# Patient Record
Sex: Female | Born: 1950 | Race: White | Hispanic: No | Marital: Married | Smoking: Current every day smoker
Health system: Southern US, Community
[De-identification: ages and names within clinical notes are randomized; demographics above are authoritative.]

## PROBLEM LIST (undated history)

## (undated) DIAGNOSIS — J302 Other seasonal allergic rhinitis: Secondary | ICD-10-CM

## (undated) DIAGNOSIS — R011 Cardiac murmur, unspecified: Secondary | ICD-10-CM

## (undated) HISTORY — DX: Cardiac murmur, unspecified: R01.1

## (undated) HISTORY — PX: HAMMER TOE SURGERY: SHX385

## (undated) HISTORY — DX: Other seasonal allergic rhinitis: J30.2

---

## 1998-02-15 ENCOUNTER — Emergency Department (HOSPITAL_COMMUNITY): Admission: EM | Admit: 1998-02-15 | Discharge: 1998-02-15 | Payer: Self-pay | Admitting: Emergency Medicine

## 1998-07-06 ENCOUNTER — Other Ambulatory Visit: Admission: RE | Admit: 1998-07-06 | Discharge: 1998-07-06 | Payer: Self-pay | Admitting: Gynecology

## 1998-07-25 ENCOUNTER — Other Ambulatory Visit: Admission: RE | Admit: 1998-07-25 | Discharge: 1998-07-25 | Payer: Self-pay | Admitting: Gynecology

## 1998-08-18 ENCOUNTER — Ambulatory Visit (HOSPITAL_COMMUNITY): Admission: RE | Admit: 1998-08-18 | Discharge: 1998-08-18 | Payer: Self-pay | Admitting: Gynecology

## 1999-09-03 ENCOUNTER — Encounter: Admission: RE | Admit: 1999-09-03 | Discharge: 1999-09-03 | Payer: Self-pay | Admitting: Gynecology

## 1999-09-03 ENCOUNTER — Encounter: Payer: Self-pay | Admitting: Gynecology

## 1999-09-06 ENCOUNTER — Encounter: Admission: RE | Admit: 1999-09-06 | Discharge: 1999-09-06 | Payer: Self-pay | Admitting: Gynecology

## 1999-09-06 ENCOUNTER — Encounter: Payer: Self-pay | Admitting: Gynecology

## 2001-06-15 ENCOUNTER — Other Ambulatory Visit: Admission: RE | Admit: 2001-06-15 | Discharge: 2001-06-15 | Payer: Self-pay | Admitting: Gynecology

## 2002-09-06 ENCOUNTER — Other Ambulatory Visit: Admission: RE | Admit: 2002-09-06 | Discharge: 2002-09-06 | Payer: Self-pay | Admitting: Gynecology

## 2009-06-15 ENCOUNTER — Encounter: Admission: RE | Admit: 2009-06-15 | Discharge: 2009-06-15 | Payer: Self-pay | Admitting: Family Medicine

## 2012-10-08 ENCOUNTER — Other Ambulatory Visit: Payer: Self-pay | Admitting: Obstetrics and Gynecology

## 2012-10-08 DIAGNOSIS — R928 Other abnormal and inconclusive findings on diagnostic imaging of breast: Secondary | ICD-10-CM

## 2012-10-29 ENCOUNTER — Ambulatory Visit
Admission: RE | Admit: 2012-10-29 | Discharge: 2012-10-29 | Disposition: A | Discharge status: Home / Self Care | Payer: BC Managed Care – PPO | Source: Ambulatory Visit | Attending: Obstetrics and Gynecology | Admitting: Obstetrics and Gynecology

## 2012-10-29 DIAGNOSIS — R928 Other abnormal and inconclusive findings on diagnostic imaging of breast: Secondary | ICD-10-CM

## 2013-11-04 ENCOUNTER — Other Ambulatory Visit: Payer: Self-pay

## 2013-11-04 DIAGNOSIS — Z1231 Encounter for screening mammogram for malignant neoplasm of breast: Secondary | ICD-10-CM

## 2013-11-25 ENCOUNTER — Ambulatory Visit
Admission: RE | Admit: 2013-11-25 | Discharge: 2013-11-25 | Disposition: A | Discharge status: Home / Self Care | Payer: BC Managed Care – PPO | Source: Ambulatory Visit

## 2013-11-25 DIAGNOSIS — Z1231 Encounter for screening mammogram for malignant neoplasm of breast: Secondary | ICD-10-CM

## 2014-07-20 LAB — LIPID PANEL
Cholesterol: 173 mg/dL (ref 0–200)
HDL: 43 mg/dL (ref 35–70)
LDL Cholesterol: 91 mg/dL
Triglycerides: 196 mg/dL — AB (ref 40–160)

## 2014-07-20 LAB — HEPATIC FUNCTION PANEL
ALT: 19 U/L (ref 7–35)
AST: 15 U/L (ref 13–35)
Alkaline Phosphatase: 86 U/L (ref 25–125)
BILIRUBIN, TOTAL: 0.7 mg/dL

## 2014-07-20 LAB — BASIC METABOLIC PANEL
BUN: 14 mg/dL (ref 4–21)
CREATININE: 0.7 mg/dL (ref 0.5–1.1)
GLUCOSE: 96 mg/dL
POTASSIUM: 4.2 mmol/L (ref 3.4–5.3)
Sodium: 137 mmol/L (ref 137–147)

## 2014-07-20 LAB — TSH: TSH: 2.7 u[IU]/mL (ref 0.41–5.90)

## 2014-07-20 LAB — HEMOGLOBIN A1C: HEMOGLOBIN A1C: 6

## 2015-12-19 ENCOUNTER — Ambulatory Visit (INDEPENDENT_AMBULATORY_CARE_PROVIDER_SITE_OTHER): Payer: 59

## 2015-12-19 ENCOUNTER — Encounter: Payer: Self-pay | Admitting: Podiatry

## 2015-12-19 ENCOUNTER — Ambulatory Visit (INDEPENDENT_AMBULATORY_CARE_PROVIDER_SITE_OTHER): Payer: 59 | Admitting: Podiatry

## 2015-12-19 VITALS — BP 144/97 | HR 106 | Resp 16 | Ht 61.0 in | Wt 168.0 lb

## 2015-12-19 DIAGNOSIS — L600 Ingrowing nail: Secondary | ICD-10-CM | POA: Diagnosis not present

## 2015-12-19 DIAGNOSIS — M2011 Hallux valgus (acquired), right foot: Secondary | ICD-10-CM | POA: Diagnosis not present

## 2015-12-19 DIAGNOSIS — M205X1 Other deformities of toe(s) (acquired), right foot: Secondary | ICD-10-CM | POA: Diagnosis not present

## 2015-12-19 DIAGNOSIS — M2041 Other hammer toe(s) (acquired), right foot: Secondary | ICD-10-CM

## 2015-12-19 MED ORDER — NEOMYCIN-POLYMYXIN-HC 3.5-10000-1 OT SOLN: OTIC | Status: DC

## 2015-12-19 NOTE — Patient Instructions (Addendum)

## 2015-12-19 NOTE — Progress Notes (Signed)
   Subjective:    Patient ID: Meghan Cox, female    DOB: 06/12/1951, 65 y.o.   MRN: [id number]  HPI: She presents today as a 65 year old female with a chief complaint of a painful first metatarsophalangeal joint and second toe as well as her primary complaint being a painful hallux nail right. She states that the nail is thick and painful and it hurts if it is touched. She states that the bunion deformity has even drained before. She states that it has limited her from performing her daily activities that she enjoys.    Review of Systems  All other systems reviewed and are negative.      Objective:   Physical Exam: Vital signs are stable she is alert and oriented 3. Pulses are palpable. Neurologic sensorium is intact per Semmes-Weinstein monofilament. Deep tendon reflexes are intact. Muscle strength +5 over 5 dorsiflexion plantar flexors and inverters and everters, all intrinsic musculature is intact. Orthopedic evaluation demonstrates severe hallux abductovalgus deformity with hallux limitus of the first metatarsophalangeal joint of the right foot. Large dorsal spur is noted on the right foot and is visible on radiograph as well. There is a small punctate lesion where drainage has been present in the past. Radiograph does confirm complete loss of joint space with hallux valgus deformity as well as dorsal spurring being severe. Mild hammertoe deformity second digit right foot. Confirmed by radiograph as well. Cutaneous evaluation demonstrates painful hallux right foot. Mild paronychia sharp incurvated nail margins.      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 Hammertoe deformity second digit right foot. Ingrown toenail tibial and fibular border of the hallux right.  Plan: We discussed etiology pathology conservative or surgical therapies. We discussed options for the first metatarsophalangeal joint. At this point we also performed a chemical  matrixectomy to the tibial and fibular border of the hallux right foot. This is performed after local anesthesia was administered. She tolerated this procedure well and I will follow-up with her in 1 week. She was given both oral and written home-going instructions for care and soaking of her toe. She was also provided with a prescription for Cortisporin Otic to be applied twice daily after soaking. We will once again discuss permanent correction of the first metatarsophalangeal joint next visit.

## 2015-12-28 ENCOUNTER — Encounter: Payer: Self-pay | Admitting: Podiatry

## 2015-12-28 ENCOUNTER — Ambulatory Visit (INDEPENDENT_AMBULATORY_CARE_PROVIDER_SITE_OTHER): Payer: 59 | Admitting: Podiatry

## 2015-12-28 DIAGNOSIS — L600 Ingrowing nail: Secondary | ICD-10-CM

## 2015-12-28 NOTE — Patient Instructions (Signed)

## 2015-12-28 NOTE — Progress Notes (Signed)
She presents today for follow-up of matrixectomy hallux right. She states it seems to be doing very well she's happy with the outcome thus far. She continues to second Betadine warm water and apply Cortisporin otic as directed.  Objective: Vital signs are stable she is alert and oriented 3. No erythema edema cellulitis drainage or odor to the hallux right status post matrixectomy. This appears to be healing in very nicely with granulation tissue and epithelialization. No signs of infection.  Assessment: Well-healing matrixectomy hallux right.  Plan: I recommend that she continue soaking in Epsom salts and warm water covered in the daytime and leave open at bedtime. I also recommended that we consider surgical discussion at her next visit regarding her Keller arthroplasty first metatarsophalangeal joint in 2 weeks.

## 2016-01-05 ENCOUNTER — Other Ambulatory Visit: Payer: Self-pay | Admitting: Gastroenterology

## 2016-01-05 LAB — HM COLONOSCOPY

## 2016-01-11 ENCOUNTER — Encounter: Payer: Self-pay | Admitting: Podiatry

## 2016-01-11 ENCOUNTER — Ambulatory Visit (INDEPENDENT_AMBULATORY_CARE_PROVIDER_SITE_OTHER): Payer: 59 | Admitting: Podiatry

## 2016-01-11 VITALS — BP 113/75 | HR 106 | Resp 16

## 2016-01-11 DIAGNOSIS — M205X1 Other deformities of toe(s) (acquired), right foot: Secondary | ICD-10-CM

## 2016-01-11 NOTE — Progress Notes (Signed)
She presents today for follow-up of her matrixectomy hallux right. She states it is doing very well and she has discontinued soaking. She would like to go over his surgical consent today regarding her right foot. She states that the right first metatarsophalangeal joint as well as the left are extremely painful. They have large bumps that do not allow her to wear regular shoes and are painful all the time. She states that her toes don't band and is like walking on the end of the toe all the time. She denies changes in her past medical history medications allergies surgeries and social history.  Objective: Vital signs are stable alert and oriented 3. Pulses are palpable. She has no range of motion of the first metatarsophalangeal joint large dorsal spur almost protruding through the skin at this point. Capillary fill time to the first digit is immediate. She has mild edema about the foot and the surgical site has gone on to heal uneventfully.  Assessment: Well-healing matrixectomy tibial border hallux right. Severe hallux rigidus first metatarsophalangeal joint of the right foot.  Plan: Discussed etiology pathology conservative versus surgical therapies. At this point we consented her today for a Keller arthroplasty with a single silicone implant first metatarsophalangeal joint of the left foot. I answered all questions regarding this procedure to the best of my ability in layman's terms. She understands this is amenable to inside all 3 pages of the consent form. We did discuss the possible postop complications which may include postop pain bleeding swelling infection recurrence and need for further surgery loss of digit loss of limb loss of life. She understands this is amenable to it and sign off. Age of the consent form. We dispensed a cam walker for her recovery and I will follow up with her in the near future for surgery.

## 2016-01-11 NOTE — Patient Instructions (Signed)
Pre-Operative Instructions  Congratulations, you have decided to take an important step to improving your quality of life.  You can be assured that the doctors of [facility] will be with you every step of the way.  1. Plan to be at the surgery center/hospital at least 1 (one) hour prior to your scheduled time unless otherwise directed by the surgical center/hospital staff.  You must have a responsible adult accompany you, remain during the surgery and drive you home.  Make sure you have directions to the surgical center/hospital and know how to get there on time. 2. For hospital based surgery you will need to obtain a history and physical form from your family physician within 1 month prior to the date of surgery- we will give you a form for you primary physician.  3. We make every effort to accommodate the date you request for surgery.  There are however, times where surgery dates or times have to be moved.  We will contact you as soon as possible if a change in schedule is required.   4. No Aspirin/Ibuprofen for one week before surgery.  If you are on aspirin, any non-steroidal anti-inflammatory medications (Mobic, Aleve, Ibuprofen) you should stop taking it 7 days prior to your surgery.  You make take Tylenol  For pain prior to surgery.  5. Medications- If you are taking daily heart and blood pressure medications, seizure, reflux, allergy, asthma, anxiety, pain or diabetes medications, make sure the surgery center/hospital is aware before the day of surgery so they may notify you which medications to take or avoid the day of surgery. 6. No food or drink after midnight the night before surgery unless directed otherwise by surgical center/hospital staff. 7. No alcoholic beverages 24 hours prior to surgery.  No smoking 24 hours prior to or 24 hours after surgery. 8. Wear loose pants or shorts- loose enough to fit over bandages, boots, and casts. 9. No slip on shoes, sneakers are best. 10. Bring  your boot with you to the surgery center/hospital.  Also bring crutches or a walker if your physician has prescribed it for you.  If you do not have this equipment, it will be provided for you after surgery. 11. If you have not been contracted by the surgery center/hospital by the day before your surgery, call to confirm the date and time of your surgery. 12. Leave-time from work may vary depending on the type of surgery you have.  Appropriate arrangements should be made prior to surgery with your employer. 13. Prescriptions will be provided immediately following surgery by your doctor.  Have these filled as soon as possible after surgery and take the medication as directed. 14. Remove nail polish on the operative foot. 15. Wash the night before surgery.  The night before surgery wash the foot and leg well with the antibacterial soap provided and water paying special attention to beneath the toenails and in between the toes.  Rinse thoroughly with water and dry well with a towel.  Perform this wash unless told not to do so by your physician.  Enclosed: 1 Ice pack (please put in freezer the night before surgery)   1 Hibiclens skin cleaner   Pre-op Instructions  If you have any questions regarding the instructions, do not hesitate to call our office.  [place]: [address] [place], [place] [address] [phone]  [place]: [address] [place], [place] [address] [phone]  [place]: [address]  [place], [place] [address] [phone]  Dr. Richard   Tuchman DPM, Dr. Norman Regal DPM Dr. Richard Sikora DPM, Dr. M. Todd Hyatt DPM, Dr. Kathryn Egerton DPM 

## 2016-02-28 ENCOUNTER — Other Ambulatory Visit: Payer: Self-pay | Admitting: Podiatry

## 2016-02-28 MED ORDER — PROMETHAZINE HCL 25 MG PO TABS: 25.0000 mg | ORAL_TABLET | Freq: Three times a day (TID) | ORAL | Status: DC | PRN

## 2016-02-28 MED ORDER — OXYCODONE-ACETAMINOPHEN 10-325 MG PO TABS: ORAL_TABLET | ORAL | Status: DC

## 2016-02-28 MED ORDER — CEPHALEXIN 500 MG PO CAPS: 500.0000 mg | ORAL_CAPSULE | Freq: Three times a day (TID) | ORAL | Status: DC

## 2016-03-01 ENCOUNTER — Encounter: Payer: Self-pay | Admitting: Podiatry

## 2016-03-01 DIAGNOSIS — M2011 Hallux valgus (acquired), right foot: Secondary | ICD-10-CM | POA: Diagnosis not present

## 2016-03-04 ENCOUNTER — Telehealth: Payer: Self-pay | Admitting: *Deleted

## 2016-03-04 NOTE — Progress Notes (Signed)
DOS 03/01/2016 Lorenz CoasterKeller Bunionectomy with silicone implant 1st MPJ right foot.

## 2016-03-04 NOTE — Telephone Encounter (Signed)
Post op courtesy call-I spoke with pt's husband, Leonette MostCharles states pt is at work, I could call her on her mobile phone.  I spoke with pt, she states she's doing great, didn't even have to take a pill, is sitting at her desk with her leg on a pillow.  I reminded pt of the RICE instructions on her post op sheet, to not let the foot dangle or weight bear more than 15 mins/hour, remain in the boot at all times, leave the original surgical dressing in place it will be changed at the 1st POV by Dr. Al CorpusHyatt and to call with concerns.

## 2016-03-04 NOTE — Telephone Encounter (Signed)
ok 

## 2016-03-07 ENCOUNTER — Ambulatory Visit (INDEPENDENT_AMBULATORY_CARE_PROVIDER_SITE_OTHER): Payer: 59

## 2016-03-07 ENCOUNTER — Ambulatory Visit (INDEPENDENT_AMBULATORY_CARE_PROVIDER_SITE_OTHER): Payer: 59 | Admitting: Podiatry

## 2016-03-07 VITALS — BP 124/82 | HR 75 | Temp 98.2°F | Resp 16

## 2016-03-07 DIAGNOSIS — M2041 Other hammer toe(s) (acquired), right foot: Secondary | ICD-10-CM

## 2016-03-07 DIAGNOSIS — Z9889 Other specified postprocedural states: Secondary | ICD-10-CM | POA: Diagnosis not present

## 2016-03-07 DIAGNOSIS — M205X1 Other deformities of toe(s) (acquired), right foot: Secondary | ICD-10-CM

## 2016-03-08 NOTE — Progress Notes (Signed)
Subjective:     Patient ID: Meghan FinnerJanice M Egnor, female   DOB: 1951-02-04, 65 y.o.   MRN: [id number]  HPI patient states that she's doing well with her right foot with minimal pain or swelling and able to walk on her foot with boot   Review of Systems     Objective:   Physical Exam Neurovascular status intact negative Homans sign was noted with wound edges well coapted first MPJ right. Range of motion is adequate with no crepitus of the joint and patient has minimal proximal edema noted    Assessment:     Doing well post implant procedure first MPJ    Plan:     H&P and x-rays reviewed with patient. At this point I reapplied sterile dressing instructed on continued immobilization gradual range of motion exercises and reappoint for evaluation in 2 weeks or earlier if needed  X-ray report indicated that the implant is in place with good alignment noted

## 2016-03-19 ENCOUNTER — Ambulatory Visit (INDEPENDENT_AMBULATORY_CARE_PROVIDER_SITE_OTHER): Payer: 59 | Admitting: Podiatry

## 2016-03-19 ENCOUNTER — Encounter: Payer: Self-pay | Admitting: Podiatry

## 2016-03-19 VITALS — BP 144/85 | HR 88 | Resp 16

## 2016-03-19 DIAGNOSIS — Z9889 Other specified postprocedural states: Secondary | ICD-10-CM | POA: Diagnosis not present

## 2016-03-19 DIAGNOSIS — M2041 Other hammer toe(s) (acquired), right foot: Secondary | ICD-10-CM

## 2016-03-19 DIAGNOSIS — M205X1 Other deformities of toe(s) (acquired), right foot: Secondary | ICD-10-CM | POA: Diagnosis not present

## 2016-03-20 NOTE — Progress Notes (Signed)
She presents today 2-1/2 weeks status post Keller implant right foot. She states this seems to be doing okay.  Objective: Vital signs are stable she is alert and oriented 3. There is no erythema edema cellulitis drainage or odor she has good range of motion of the first metatarsophalangeal joint. Sutures were removed and margins remain well coapted but allow her to start getting this wet and massaging it.  Assessment: Well-healing surgical foot date of surgery 03/01/2016 Keller arthroplasty right foot.  Plan: I placed her in a Darco shoe today with compression anklet and I will follow-up with her in 2 weeks.

## 2016-04-04 ENCOUNTER — Encounter: Payer: Self-pay | Admitting: Podiatry

## 2016-04-04 ENCOUNTER — Ambulatory Visit (INDEPENDENT_AMBULATORY_CARE_PROVIDER_SITE_OTHER): Payer: 59

## 2016-04-04 ENCOUNTER — Ambulatory Visit (INDEPENDENT_AMBULATORY_CARE_PROVIDER_SITE_OTHER): Payer: 59 | Admitting: Podiatry

## 2016-04-04 VITALS — BP 125/80 | HR 100 | Resp 12

## 2016-04-04 DIAGNOSIS — M2041 Other hammer toe(s) (acquired), right foot: Secondary | ICD-10-CM

## 2016-04-04 DIAGNOSIS — M205X1 Other deformities of toe(s) (acquired), right foot: Secondary | ICD-10-CM

## 2016-04-04 DIAGNOSIS — Z9889 Other specified postprocedural states: Secondary | ICD-10-CM

## 2016-04-04 NOTE — Progress Notes (Signed)
She presents today for follow-up of Keller arthroplasty with a single silicone implant right foot. She states that she is just about ready to get out of her Darco shoe. She states that the foot really doesn't hurt anymore and she is ready to get going. She denies fever chills nausea vomiting muscle aches and pains. States that he has really taken a turn for the better over the past few days.  Objective: Vital signs are stable she's alert and oriented 3 she has great range of motion of the first metatarsophalangeal joint with almost no edema to the foot. Radiographs demonstrate a well-placed Keller arthroplasty with a single silicone implant and grommets collars.  Assessment: Well-healing surgical foot 1 month.  Plan: I encouraged range of motion exercises and will allow her to get back into regular shoe gear. Follow up with me in 1 month.

## 2016-04-11 LAB — HM PAP SMEAR: HM Pap smear: NORMAL

## 2016-04-23 ENCOUNTER — Other Ambulatory Visit: Payer: Self-pay | Admitting: Obstetrics and Gynecology

## 2016-04-23 DIAGNOSIS — R928 Other abnormal and inconclusive findings on diagnostic imaging of breast: Secondary | ICD-10-CM

## 2016-05-02 ENCOUNTER — Encounter: Payer: 59 | Admitting: Podiatry

## 2016-05-03 ENCOUNTER — Ambulatory Visit
Admission: RE | Admit: 2016-05-03 | Discharge: 2016-05-03 | Disposition: A | Discharge status: Home / Self Care | Payer: 59 | Source: Ambulatory Visit | Attending: Obstetrics and Gynecology | Admitting: Obstetrics and Gynecology

## 2016-05-03 DIAGNOSIS — R928 Other abnormal and inconclusive findings on diagnostic imaging of breast: Secondary | ICD-10-CM

## 2016-05-14 ENCOUNTER — Encounter: Payer: 59 | Admitting: Podiatry

## 2016-05-28 ENCOUNTER — Encounter: Payer: 59 | Admitting: Podiatry

## 2016-06-04 ENCOUNTER — Encounter: Payer: 59 | Admitting: Podiatry

## 2017-02-13 ENCOUNTER — Encounter: Payer: Self-pay | Admitting: *Deleted

## 2017-02-13 NOTE — Telephone Encounter (Signed)
This encounter was created in error - please disregard.

## 2017-02-14 ENCOUNTER — Ambulatory Visit: Payer: 59 | Admitting: Family Medicine

## 2017-02-21 ENCOUNTER — Ambulatory Visit (INDEPENDENT_AMBULATORY_CARE_PROVIDER_SITE_OTHER): Payer: 59 | Admitting: Family Medicine

## 2017-02-21 ENCOUNTER — Encounter: Payer: Self-pay | Admitting: Family Medicine

## 2017-02-21 VITALS — BP 136/88 | HR 86 | Temp 98.4°F | Ht 61.0 in | Wt 173.6 lb

## 2017-02-21 DIAGNOSIS — Z0000 Encounter for general adult medical examination without abnormal findings: Secondary | ICD-10-CM | POA: Diagnosis not present

## 2017-02-21 DIAGNOSIS — E785 Hyperlipidemia, unspecified: Secondary | ICD-10-CM

## 2017-02-21 DIAGNOSIS — F17200 Nicotine dependence, unspecified, uncomplicated: Secondary | ICD-10-CM

## 2017-02-21 LAB — COMPREHENSIVE METABOLIC PANEL
ALT: 15 U/L (ref 0–35)
AST: 14 U/L (ref 0–37)
Albumin: 4 g/dL (ref 3.5–5.2)
Alkaline Phosphatase: 74 U/L (ref 39–117)
BUN: 13 mg/dL (ref 6–23)
CO2: 26 mEq/L (ref 19–32)
Calcium: 9.3 mg/dL (ref 8.4–10.5)
Chloride: 108 mEq/L (ref 96–112)
Creatinine, Ser: 0.78 mg/dL (ref 0.40–1.20)
GFR: 78.73 mL/min (ref >60.00)
Glucose, Bld: 100 mg/dL — ABNORMAL HIGH (ref 70–99)
Potassium: 4.1 mEq/L (ref 3.5–5.1)
Sodium: 139 mEq/L (ref 135–145)
Total Bilirubin: 0.5 mg/dL (ref 0.2–1.2)
Total Protein: 7 g/dL (ref 6.0–8.3)

## 2017-02-21 LAB — LDL CHOLESTEROL, DIRECT: Direct LDL: 176 mg/dL

## 2017-02-21 LAB — LIPID PANEL
Cholesterol: 259 mg/dL — ABNORMAL HIGH (ref 0–200)
HDL: 44.2 mg/dL (ref >39.00)
NonHDL: 214.57
Total CHOL/HDL Ratio: 6
Triglycerides: 251 mg/dL — ABNORMAL HIGH (ref 0.0–149.0)
VLDL: 50.2 mg/dL — ABNORMAL HIGH (ref 0.0–40.0)

## 2017-02-21 MED ORDER — ATORVASTATIN CALCIUM 20 MG PO TABS: 20.0000 mg | ORAL_TABLET | Freq: Every day | ORAL | 3 refills | Status: DC

## 2017-02-21 NOTE — Patient Instructions (Signed)
   [place] QUITLINE: 1-800-QUIT-NOW (1-800-784-8669)  

## 2017-02-21 NOTE — Progress Notes (Signed)
Meghan Cox is a 66 y.o. female is here to [facility]   History of Present Illness:   Britt Bottom CMA acting as scribe for Dr. Earlene Plater.  CC: Patient is here today to establish care today. No concerns at this time.   HPI:  Meghan Cox is a 66 y.o. female who presents for evaluation of dyslipidemia. The patient does not use medications that may worsen dyslipidemias (corticosteroids, progestins, anabolic steroids, diuretics, beta-blockers, amiodarone, cyclosporine, olanzapine). Exercise: intermittently. Previous history of cardiac disease includes: None. Cardiovascular ROS: no chest pain or dyspnea on exertion.  Cardiac Risk Factors Age > 45-female, > 55-female:  YES  +1  Smoking:   YES  +1  Sig. family hx of CHD*:  NO  Hypertension:   NO  Diabetes:   NO  HDL < 35:   NO  HDL > 59:   NO  Total:  2   *Significant family history of CHD per NCEP = MI or sudden death at less than 35 year old in father or other 1st-degree female relative, or less than 72 year old in mother or other 1st-degree female relative.  Lipids:    Component Value Date/Time   CHOL 259 (H) 02/21/2017 0844   TRIG 251.0 (H) 02/21/2017 0844   HDL 44.20 02/21/2017 0844   LDLDIRECT 176.0 02/21/2017 0844   VLDL 50.2 (H) 02/21/2017 0844   CHOLHDL 6 02/21/2017 0844   Health Maintenance Due  Topic Date Due  . Hepatitis C Screening  1951-09-23  . HIV Screening  12/15/1966  . TETANUS/TDAP  12/15/1970  . PNA vac Low Risk Adult (1 of 2 - PCV13) 12/15/2016   PMHx, SurgHx, SocialHx, Medications, and Allergies were reviewed in the Visit Navigator and updated as appropriate.   Past Medical History:  Diagnosis Date  . Heart murmur   . Seasonal allergies    Past Surgical History:  Procedure Laterality Date  . HAMMER TOE SURGERY     Family History  Problem Relation Age of Onset  . Heart disease Mother   . Hyperlipidemia Mother   . Cancer Father     Prostate  . Hypertension Father   .  Hypertension Maternal Grandmother   . Hypertension Maternal Grandfather   . Cancer Paternal Grandmother     Breast  . Hypertension Paternal Grandfather    Social History  Substance Use Topics  . Smoking status: Current Every Day Smoker    Packs/day: 0.50    Types: Cigarettes  . Smokeless tobacco: Never Used  . Alcohol use No   Current Medications and Allergies:   Current Outpatient Prescriptions:  .  atorvastatin (LIPITOR) 20 MG tablet, Take 1 tablet (20 mg total) by mouth daily., Disp: 90 tablet, Rfl: 3 .  loratadine (CLARITIN) 10 MG tablet, Take 10 mg by mouth daily., Disp: , Rfl:   Allergies  Allergen Reactions  . Other     Peaches-makes throat swell up   Review of Systems:   Review of Systems  Constitutional: Negative for chills, fever and malaise/fatigue.  HENT: Negative for congestion, ear pain, sinus pain and sore throat.   Eyes: Negative for blurred vision and double vision.  Respiratory: Negative for cough, shortness of breath and wheezing.   Cardiovascular: Negative for chest pain, palpitations and leg swelling.  Gastrointestinal: Negative for abdominal pain, constipation, diarrhea and nausea.  Genitourinary: Negative for dysuria.  Musculoskeletal: Negative for back pain, joint pain and neck pain.  Skin: Negative for itching and rash.  Neurological: Negative  for dizziness and headaches.  Psychiatric/Behavioral: Negative for depression, hallucinations and memory loss.    Vitals:   Vitals:   02/21/17 0746  BP: 136/88  Pulse: 86  Temp: 98.4 F (36.9 C)  TempSrc: Oral  SpO2: 97%  Weight: 173 lb 9.6 oz (78.7 kg)  Height:  (1.549 m)     Body mass index is 32.8 kg/m.  Physical Exam:   Physical Exam  Constitutional: She is oriented to person, place, and time. She appears well-developed and well-nourished. No distress.  HENT:  Head: Normocephalic and atraumatic.  Eyes: Conjunctivae and EOM are normal. Pupils are equal, round, and reactive to light.   Neck: Normal range of motion. Neck supple. No thyromegaly present.  Cardiovascular: Normal rate, regular rhythm, normal heart sounds and intact distal pulses.   Pulmonary/Chest: Effort normal and breath sounds normal.  Abdominal: Soft. Bowel sounds are normal.  Musculoskeletal: Normal range of motion.  Neurological: She is alert and oriented to person, place, and time.  Skin: Skin is warm.  Psychiatric: She has a normal mood and affect. Her behavior is normal.  Nursing note and vitals reviewed.    Assessment and Plan:    Kleigh was seen today for establish care.  Diagnoses and all orders for this visit:  Routine physical examination -     Comprehensive metabolic panel  Hyperlipidemia, unspecified hyperlipidemia type -     Lipid panel -     atorvastatin (LIPITOR) 20 MG tablet; Take 1 tablet (20 mg total) by mouth daily.  Smoker Comments: Tobacco Cessation Counseling:  Smoking cessation counseling was provided.  Approximately 10 minutes were spent discussing the rationale for tobacco cessation and strategies for doing so.  Adjuncts, including nicotine patches, varenicline and buproprion were recommended. Patient is eligible for low-dose CT scan for lung cancer screening based on the criteria noted in the HPI. She will consider this.    . Reviewed expectations re: course of current medical issues. . Discussed self-management of symptoms. . Outlined signs and symptoms indicating need for more acute intervention. . Patient verbalized understanding and all questions were answered. . See orders for this visit as documented in the electronic medical record. . Patient received an After Visit Summary.  CMA served as Neurosurgeon during this visit. History, Physical, and Plan performed by medical provider. Documentation and orders reviewed and attested to. Helane Rima, D.O.  Helane Rima, D.O. [place], [facility] 02/21/2017   Follow-up: Return in about 6 months (around  08/23/2017).  Meds ordered this encounter  Medications  . atorvastatin (LIPITOR) 20 MG tablet    Sig: Take 1 tablet (20 mg total) by mouth daily.    Dispense:  90 tablet    Refill:  3   Medications Discontinued During This Encounter  Medication Reason  . atorvastatin (LIPITOR) 20 MG tablet Reorder   Orders Placed This Encounter  Procedures  . Comprehensive metabolic panel  . Lipid panel  . LDL cholesterol, direct

## 2017-02-24 ENCOUNTER — Encounter: Payer: Self-pay | Admitting: Family Medicine

## 2017-03-05 ENCOUNTER — Telehealth: Payer: Self-pay | Admitting: Family Medicine

## 2017-03-05 NOTE — Telephone Encounter (Signed)
[facility] on [address]

## 2017-03-18 ENCOUNTER — Telehealth: Payer: Self-pay | Admitting: Family Medicine

## 2017-03-18 NOTE — Telephone Encounter (Signed)
Rec'd from [facility] [employer] forward 27 Pages to [employer]. BerkleyErica Wallace DO

## 2017-03-26 ENCOUNTER — Encounter: Payer: Self-pay | Admitting: Family Medicine

## 2017-03-26 LAB — VITAMIN D 1,25 DIHYDROXY: Vit D, 25-Hydroxy: 12.9

## 2017-04-11 ENCOUNTER — Ambulatory Visit (INDEPENDENT_AMBULATORY_CARE_PROVIDER_SITE_OTHER): Payer: 59 | Admitting: *Deleted

## 2017-04-11 DIAGNOSIS — Z23 Encounter for immunization: Secondary | ICD-10-CM

## 2017-07-01 ENCOUNTER — Ambulatory Visit (INDEPENDENT_AMBULATORY_CARE_PROVIDER_SITE_OTHER): Payer: 59 | Admitting: Family Medicine

## 2017-07-01 ENCOUNTER — Telehealth: Payer: Self-pay | Admitting: Family Medicine

## 2017-07-01 ENCOUNTER — Encounter: Payer: Self-pay | Admitting: Family Medicine

## 2017-07-01 VITALS — BP 180/100 | HR 98 | Ht 61.0 in | Wt 173.4 lb

## 2017-07-01 DIAGNOSIS — M7632 Iliotibial band syndrome, left leg: Secondary | ICD-10-CM

## 2017-07-01 DIAGNOSIS — E669 Obesity, unspecified: Secondary | ICD-10-CM

## 2017-07-01 DIAGNOSIS — F17210 Nicotine dependence, cigarettes, uncomplicated: Secondary | ICD-10-CM

## 2017-07-01 DIAGNOSIS — R030 Elevated blood-pressure reading, without diagnosis of hypertension: Secondary | ICD-10-CM

## 2017-07-01 MED ORDER — DICLOFENAC SODIUM 75 MG PO TBEC: 75.0000 mg | DELAYED_RELEASE_TABLET | Freq: Two times a day (BID) | ORAL | 0 refills | Status: DC

## 2017-07-01 MED ORDER — CYCLOBENZAPRINE HCL 10 MG PO TABS
10.0000 mg | ORAL_TABLET | Freq: Three times a day (TID) | ORAL | 0 refills | Status: DC | PRN
Start: 2017-07-01 — End: 2017-07-21

## 2017-07-01 NOTE — Progress Notes (Signed)
Meghan RutterJanice Marie Cox is a 66 y.o. female here for an acute visit.  History of Present Illness:   Autumn Uvaldo RisingMcNeil, cma is acting as a Neurosurgeonscribe for W[employer] R[employer] BerkleyErica Monta Maiorana, DO.  HPI:  Meghan NixonJanice was moving 3 weeks ago when she felt a pull in her left lower back. Took Ibuprofen and sx resolved hours later. Next morning she reports Left anterior Hip Pain radiating down to anterior left thigh. Describes the pain as dull, pulsating, tightness. Triggers are first thing in the morning, standing for long periods of time and walking. Some relief with rest, icy hot, hot shower, and alternating Ibuprofen/tylenol. Pending bone density scan in October. No issues with sleeping.  PMHx, SurgHx, SocialHx, Medications, and Allergies were reviewed in the Visit Navigator and updated as appropriate.  Current Medications:   .  atorvastatin (LIPITOR) 20 MG tablet, Take 1 tablet (20 mg total) by mouth daily., Disp: 90 tablet, Rfl: 3 .  loratadine (CLARITIN) 10 MG tablet, Take 10 mg by mouth daily., Disp: , Rfl:    Allergies  Allergen Reactions  . Other     Peaches-makes throat swell up   Review of Systems:   Pertinent items are noted in the HPI. Otherwise, ROS is negative.  Vitals:   Vitals:   07/01/17 1530  BP: (!) 180/100  Pulse: 98  SpO2: 97%  Weight: 173 lb 6.4 oz (78.7 kg)  Height: 5\' 1"  (1.549 m)     Body mass index is 32.76 kg/m.   Physical Exam:   Physical Exam  Constitutional: She appears well-nourished.  HENT:  Head: Normocephalic and atraumatic.  Eyes: Pupils are equal, round, and reactive to light. EOM are normal.  Neck: Normal range of motion. Neck supple.  Cardiovascular: Normal rate, regular rhythm, normal heart sounds and intact distal pulses.   Pulmonary/Chest: Effort normal.  Abdominal: Soft.  Musculoskeletal:  Pain at both proximal and distal insertions of IT band.   Skin: Skin is warm.  Psychiatric: She has a normal mood and affect. Her behavior is normal.  Nursing note and vitals  reviewed.   Assessment and Plan:   Meghan NixonJanice was seen today for left leg pain.  Diagnoses and all orders for this visit:  Iliotibial band syndrome of left side Comments: Orders as below along with handout of exercises and stretches. Call if not improving.  Orders: -     diclofenac (VOLTAREN) 75 MG EC tablet; Take 1 tablet (75 mg total) by mouth 2 (two) times daily. -     cyclobenzaprine (FLEXERIL) 10 MG tablet; Take 1 tablet (10 mg total) by mouth 3 (three) times daily as needed for muscle spasms.  Elevated BP without diagnosis of hypertension Comments: Will monitor in the office and at home.  Obesity (BMI 30-39.9) Comments: The patient is asked to make an attempt to improve diet and exercise patterns to aid in medical management of this problem.   Cigarette nicotine dependence without complication Comments: I advised patient to quit smoking, and offered support. [facility] QUITLINE: 1-800-QUIT-NOW 5071552956(1-302-873-0460).   . Reviewed expectations re: course of current medical issues. . Discussed self-management of symptoms. . Outlined signs and symptoms indicating need for more acute intervention. . Patient verbalized understanding and all questions were answered. Marland Kitchen. Health Maintenance issues including appropriate healthy diet, exercise, and smoking avoidance were discussed with patient. . See orders for this visit as documented in the electronic medical record. . Patient received an After Visit Summary.  CMA served as Neurosurgeonscribe during this visit. History, Physical, and Plan  performed by medical provider. The above documentation has been reviewed and is accurate and complete. Helane Rima, D.O.  Helane Rima, DO [place], [facility] 07/05/2017  Future Appointments Date Time Provider Department Center  08/25/2017 1:30 PM Helane Rima, DO [facility] None

## 2017-07-01 NOTE — Telephone Encounter (Signed)
Scheduled patient for left leg pain 2-3 weeks. She states it happened after pulling her lower back a few weeks ago. She scheduled appointment at 3:30 today.

## 2017-07-01 NOTE — Telephone Encounter (Signed)
Noted  

## 2017-07-05 DIAGNOSIS — E669 Obesity, unspecified: Secondary | ICD-10-CM | POA: Insufficient documentation

## 2017-07-05 DIAGNOSIS — R030 Elevated blood-pressure reading, without diagnosis of hypertension: Secondary | ICD-10-CM | POA: Insufficient documentation

## 2017-07-05 DIAGNOSIS — F17210 Nicotine dependence, cigarettes, uncomplicated: Secondary | ICD-10-CM | POA: Insufficient documentation

## 2017-07-21 ENCOUNTER — Encounter: Payer: Self-pay | Admitting: Sports Medicine

## 2017-07-21 ENCOUNTER — Ambulatory Visit: Payer: Self-pay

## 2017-07-21 ENCOUNTER — Ambulatory Visit (INDEPENDENT_AMBULATORY_CARE_PROVIDER_SITE_OTHER): Payer: 59 | Admitting: Sports Medicine

## 2017-07-21 VITALS — BP 136/78 | HR 88 | Wt 169.8 lb

## 2017-07-21 DIAGNOSIS — M7632 Iliotibial band syndrome, left leg: Secondary | ICD-10-CM | POA: Diagnosis not present

## 2017-07-21 NOTE — Patient Instructions (Addendum)
Please perform the exercise program that we have prepared for you and gone over in detail on a daily basis.  In addition to the handout you were provided you can access your program through: [email or web address]   Your unique program code is: [email or web address]     You had an injection today.  Things to be aware of after injection are listed below: . You may experience no significant improvement or even a slight worsening in your symptoms during the first 24 to 48 hours.  After that we expect your symptoms to improve gradually over the next 2 weeks for the medicine to have its maximal effect.  You should continue to have improvement out to 6 weeks after your injection. . Dr. Berline Chough recommends icing the site of the injection for 20 minutes  1-2 times the day of your injection . You may shower but no swimming, tub bath or Jacuzzi for 24 hours. . If your bandage falls off this does not need to be replaced.  It is appropriate to remove the bandage after 4 hours. . You may resume light activities as tolerated unless otherwise directed per Dr. Berline Chough during your visit  POSSIBLE STEROID SIDE EFFECTS:  Side effects from injectable steroids tend to be less than when taken orally however you may experience some of the symptoms listed below.  If experienced these should only last for a short period of time. Change in menstrual flow  Edema (swelling)  Increased appetite Skin flushing (redness)  Skin rash/acne  Thrush (oral) Yeast vaginitis    Increased sweating  Depression Increased blood glucose levels Cramping and leg/calf  Euphoria (feeling happy)  POSSIBLE PROCEDURE SIDE EFFECTS: The side effects of the injection are usually fairly minimal however if you may experience some of the following side effects that are usually self-limited and will is off on their own.  If you are concerned please feel free to call the office with questions:  Increased numbness or tingling  Nausea or vomiting  Swelling or bruising  at the injection site   Please call our office if if you experience any of the following symptoms over the next week as these can be signs of infection:   Fever greater than 100.61F  Significant swelling at the injection site  Significant redness or drainage from the injection site  If after 2 weeks you are continuing to have worsening symptoms please call our office to discuss what the next appropriate actions should be including the potential for a return office visit or other diagnostic testing.

## 2017-07-21 NOTE — Procedures (Signed)
PROCEDURE NOTE -  ULTRASOUND GUIDEDINJECTION: LEFT GREATER TROCHANTERIC Images were obtained and interpreted by myself, Gaspar Bidding, DO  Images have been saved and stored to PACS system. Images obtained on: GE S7 Ultrasound machine  ULTRASOUND FINDINGS: Thickening of the glute medius with greater trochanteric bursa tendon  DESCRIPTION OF PROCEDURE:  The patient's clinical condition is marked by substantial pain and/or significant functional disability. Other conservative therapy has not provided relief, is contraindicated, or not appropriate. There is a reasonable likelihood that injection will significantly improve the patient's pain and/or functional impairment. After discussing the risks, benefits and expected outcomes of the injection and all questions were reviewed and answered, the patient wished to undergo the above named procedure. Verbal consent was obtained. The ultrasound was used to identify the target structure and adjacent neurovascular structures. The skin was then prepped in sterile fashion and the target structure was injected under direct visualization using sterile technique as below: PREP: Alcohol, Ethel Chloride APPROACH: In plane, single injection, 22g 1.5" needle INJECTATE: 2cc 1% lidocaine, 2cc  DepoMedrol ASPIRATE: N/A DRESSING: Band-Aid  Post procedural instructions including recommending icing and warning signs for infection were reviewed. This procedure was well tolerated and there were no complications.   IMPRESSION: Succesful US Guided  Injection

## 2017-07-21 NOTE — Progress Notes (Signed)
OFFICE VISIT NOTE Meghan Cox. [facility] at [facility] [phone]  Meghan Cox - 66 y.o. female MRN [id number]  Date of birth: 09-04-1951  Visit Date: 07/21/2017  PCP: Helane Rima, DO   Referred by: Helane Rima, DO  Fabio Pierce PT, LAT, ATC acting as scribe for Dr. Berline Chough.  SUBJECTIVE:   Chief Complaint  Patient presents with  . New Patient (Initial Visit)    back pain   HPI: As below and per problem based documentation when appropriate.  Meghan Cox is a new patient presenting today for evaluation of back pain. She was seen by Dr. Helane Rima on 07/01/17 and dx with IT band syndrome, LT side. She was prescribed PO Voltaren and Cyclobenzaprine and was also provided with home exercises/stretches.  Symptoms initially began when attempting to lift a heavy bag of garbage in late August 2018.  Pt states that her L sided superior-lateral thigh pain has improved by 75% but is still bothering her.  Pt s with tates that previously her pain was constant but is now more intermittent in nature.  She notes the most pain with standing and walking, rating it as a 6/10.  She notes a constant ache at rest while sitting.  Pt states that she is doing her HEP 5x/week.  She states that she has finished her muscle relaxer but is still taking the Diclofenac.  Pt denies any numbness but feels some tingling along her L superior, lateral thigh like when you're foot falls asleep.  Alleviating factors include sitting w/ her L leg elevated and lying down.       Review of Systems  Constitutional: Negative.   HENT: Negative.   Eyes: Negative.   Respiratory: Negative.   Cardiovascular: Negative.   Gastrointestinal: Negative.   Genitourinary: Negative.   Musculoskeletal: Positive for back pain.  Skin: Negative.   Neurological: Negative.   Endo/Heme/Allergies: Negative.   Psychiatric/Behavioral: Negative.     Otherwise per HPI.  HISTORY &  PERTINENT PRIOR DATA:  No specialty comments available. She reports that she has been smoking Cigarettes.  She has been smoking about 0.50 packs per day. She has never used smokeless tobacco. No results for input(s): HGBA1C, LABURIC in the last 8760 hours. Medications & Allergies reviewed per EMR Patient Active Problem List   Diagnosis Date Noted  . Elevated BP without diagnosis of hypertension 07/05/2017  . Obesity (BMI 30-39.9) 07/05/2017  . Cigarette nicotine dependence without complication 07/05/2017  . Iliotibial band syndrome of left side 07/01/2017   Past Medical History:  Diagnosis Date  . Heart murmur   . Seasonal allergies    Family History  Problem Relation Age of Onset  . Heart disease Mother   . Hyperlipidemia Mother   . Cancer Father        Prostate  . Hypertension Father   . Hypertension Maternal Grandmother   . Hypertension Maternal Grandfather   . Cancer Paternal Grandmother        Breast  . Hypertension Paternal Grandfather    Past Surgical History:  Procedure Laterality Date  . HAMMER TOE SURGERY     Social History   Occupational History  . Not on file.   Social History Main Topics  . Smoking status: Current Every Day Smoker    Packs/day: 0.50    Types: Cigarettes  . Smokeless tobacco: Never Used  . Alcohol use No  . Drug use: No  . Sexual activity:  Yes    OBJECTIVE:  VS:  HT:    WT:169 lb 12.8 oz (77 kg)  BMI:32.1    BP:136/78  HR:88bpm  TEMP: ( )  RESP:98 % EXAM: Findings:  WDWN, NAD, Non-toxic appearing Alert & appropriately interactive Not depressed or anxious appearing No increased work of breathing. Pupils are equal. EOM intact without nystagmus No clubbing or cyanosis of the extremities appreciated No significant rashes/lesions/ulcerations overlying the examined area. DP & PT pulses 2+/4.  No significant pretibial edema. Sensation intact to light touch in lower extremities.  Back and hips: Overall well aligned.  She has good  internal and external rotation of bilateral hips.  She is marked tensor fascia lata predominant hip abduction with moderate weakness with isolated glute medius testing.  She has good range of motion of her hip joint and no significant pain with FADIR or FABER testing.  Normal logroll.  Normal Stinchfield.  No significant pain with straight leg raise or popliteal compression test bilaterally.  She is able to heel toe walk without difficulty.     ASSESSMENT & PLAN:     ICD-10-CM   1. Iliotibial band syndrome of left side M76.32 US GUIDED NEEDLE PLACEMENT(NO LINKED CHARGES)   ================================================================= No problem-specific Assessment & Plan notes found for this encounter.  PROCEDURE NOTE -  ULTRASOUND GUIDEDINJECTION: LEFT GREATER TROCHANTERIC Images were obtained and interpreted by myself, Gaspar Bidding, DO  Images have been saved and stored to PACS system. Images obtained on: GE S7 Ultrasound machine  ULTRASOUND FINDINGS: Thickening of the glute medius with greater trochanteric bursa tendon  DESCRIPTION OF PROCEDURE:  The patient's clinical condition is marked by substantial pain and/or significant functional disability. Other conservative therapy has not provided relief, is contraindicated, or not appropriate. There is a reasonable likelihood that injection will significantly improve the patient's pain and/or functional impairment. After discussing the risks, benefits and expected outcomes of the injection and all questions were reviewed and answered, the patient wished to undergo the above named procedure. Verbal consent was obtained. The ultrasound was used to identify the target structure and adjacent neurovascular structures. The skin was then prepped in sterile fashion and the target structure was injected under direct visualization using sterile technique as below: PREP: Alcohol, Ethel Chloride APPROACH: In plane, single injection, 22g 1.5"  needle INJECTATE: 2cc 1% lidocaine, 2cc  DepoMedrol ASPIRATE: N/A DRESSING: Band-Aid  Post procedural instructions including recommending icing and warning signs for infection were reviewed. This procedure was well tolerated and there were no complications.   IMPRESSION: Succesful US Guided  Injection     ================================================================= Patient Instructions  Please perform the exercise program that we have prepared for you and gone over in detail on a daily basis.  In addition to the handout you were provided you can access your program through: [email or web address]   Your unique program code is: [email or web address]     You had an injection today.  Things to be aware of after injection are listed below: . You may experience no significant improvement or even a slight worsening in your symptoms during the first 24 to 48 hours.  After that we expect your symptoms to improve gradually over the next 2 weeks for the medicine to have its maximal effect.  You should continue to have improvement out to 6 weeks after your injection. . Dr. Berline Chough recommends icing the site of the injection for 20 minutes  1-2 times the day of your injection . You may shower but no swimming, tub bath  or Jacuzzi for 24 hours. . If your bandage falls off this does not need to be replaced.  It is appropriate to remove the bandage after 4 hours. . You may resume light activities as tolerated unless otherwise directed per Dr. Berline Chough during your visit  POSSIBLE STEROID SIDE EFFECTS:  Side effects from injectable steroids tend to be less than when taken orally however you may experience some of the symptoms listed below.  If experienced these should only last for a short period of time. Change in menstrual flow  Edema (swelling)  Increased appetite Skin flushing (redness)  Skin rash/acne  Thrush (oral) Yeast vaginitis    Increased sweating  Depression Increased blood glucose levels Cramping  and leg/calf  Euphoria (feeling happy)  POSSIBLE PROCEDURE SIDE EFFECTS: The side effects of the injection are usually fairly minimal however if you may experience some of the following side effects that are usually self-limited and will is off on their own.  If you are concerned please feel free to call the office with questions:  Increased numbness or tingling  Nausea or vomiting  Swelling or bruising at the injection site   Please call our office if if you experience any of the following symptoms over the next week as these can be signs of infection:   Fever greater than 100.61F  Significant swelling at the injection site  Significant redness or drainage from the injection site  If after 2 weeks you are continuing to have worsening symptoms please call our office to discuss what the next appropriate actions should be including the potential for a return office visit or other diagnostic testing.    =================================================================  Follow-up: Return in about 6 weeks (around 09/01/2017), or if symptoms worsen or fail to improve.   CMA/ATC served as Neurosurgeon during this visit. History, Physical, and Plan performed by medical provider. Documentation and orders reviewed and attested to.      Gaspar Bidding, DO    [facility] Physician

## 2017-07-28 NOTE — Assessment & Plan Note (Signed)
Symptoms are consistent with glute medius tendinopathy with acute bursitis component that was injected today.  Therapeutic exercises reviewed in detail and will plan to follow-up with her in 6 weeks to ensure clinical resolution.  If any lack of improvement she can call for earlier evaluation further advanced diagnostic imaging

## 2017-08-25 ENCOUNTER — Ambulatory Visit (INDEPENDENT_AMBULATORY_CARE_PROVIDER_SITE_OTHER): Payer: 59 | Admitting: Family Medicine

## 2017-08-25 ENCOUNTER — Encounter: Payer: Self-pay | Admitting: Family Medicine

## 2017-08-25 VITALS — BP 134/78 | HR 96 | Temp 98.4°F | Ht 61.0 in | Wt 168.6 lb

## 2017-08-25 DIAGNOSIS — M79604 Pain in right leg: Secondary | ICD-10-CM

## 2017-08-25 DIAGNOSIS — E669 Obesity, unspecified: Secondary | ICD-10-CM | POA: Diagnosis not present

## 2017-08-25 MED ORDER — DICLOFENAC SODIUM 75 MG PO TBEC: 75.0000 mg | DELAYED_RELEASE_TABLET | Freq: Two times a day (BID) | ORAL | 0 refills | Status: DC

## 2017-08-25 MED ORDER — CYCLOBENZAPRINE HCL 10 MG PO TABS: 10.0000 mg | ORAL_TABLET | Freq: Three times a day (TID) | ORAL | 0 refills | Status: DC | PRN

## 2017-08-25 NOTE — Progress Notes (Signed)
Meghan Cox is a 66 y.o. female is here for follow up.  History of Present Illness:   Insurance claims handler, CMA, acting as scribe for Dr. Earlene Plater.  HPI: Pain in right hip to anterior thigh, intermittent, worse with walking, better with Flexeril and Diclofenac, no back pain, no trauma.  Health Maintenance Due  Topic Date Due  . Hepatitis C Screening  1951-05-23  . HIV Screening  12/15/1966   Depression screen PHQ 2/9 02/21/2017  Decreased Interest 0  Down, Depressed, Hopeless 0  PHQ - 2 Score 0   PMHx, SurgHx, SocialHx, FamHx, Medications, and Allergies were reviewed in the Visit Navigator and updated as appropriate.   Patient Active Problem List   Diagnosis Date Noted  . Elevated BP without diagnosis of hypertension 07/05/2017  . Obesity (BMI 30-39.9) 07/05/2017  . Cigarette nicotine dependence without complication 07/05/2017   Social History  Substance Use Topics  . Smoking status: Current Every Day Smoker    Packs/day: 0.50    Types: Cigarettes  . Smokeless tobacco: Never Used  . Alcohol use No   Current Medications and Allergies:   .  atorvastatin (LIPITOR) 20 MG tablet, Take 1 tablet (20 mg total) by mouth daily., Disp: 90 tablet, Rfl: 3 .  loratadine (CLARITIN) 10 MG tablet, Take 10 mg by mouth daily., Disp: , Rfl:   Allergies  Allergen Reactions  . Other     Peaches-makes throat swell up   Review of Systems   Pertinent items are noted in the HPI. Otherwise, ROS is negative.  Vitals:   Vitals:   08/25/17 1336  BP: 134/78  Pulse: 96  Temp: 98.4 F (36.9 C)  TempSrc: Oral  SpO2: 97%  Weight: 168 lb 9.6 oz (76.5 kg)  Height: 5\' 1"  (1.549 m)     Body mass index is 31.86 kg/m.   Physical Exam:   Physical Exam  Constitutional: She appears well-nourished.  HENT:  Head: Normocephalic and atraumatic.  Eyes: Pupils are equal, round, and reactive to light. EOM are normal.  Neck: Normal range of motion. Neck supple.  Cardiovascular: Normal rate,  regular rhythm, normal heart sounds and intact distal pulses.   Pulmonary/Chest: Effort normal.  Abdominal: Soft.  Musculoskeletal:  Tight right IT band.   Skin: Skin is warm.  Psychiatric: She has a normal mood and affect. Her behavior is normal.  Nursing note and vitals reviewed.  Assessment and Plan:   [name] was seen today for follow-up.  Diagnoses and all orders for this visit:  Right leg pain Comments: Right IT band. Stretches reviewed.  Orders: -     diclofenac (VOLTAREN) 75 MG EC tablet; Take 1 tablet (75 mg total) by mouth 2 (two) times daily. -     cyclobenzaprine (FLEXERIL) 10 MG tablet; Take 1 tablet (10 mg total) by mouth 3 (three) times daily as needed for muscle spasms.  Obesity (BMI 30-39.9) Comments: The patient is asked to make an attempt to improve diet and exercise patterns to aid in medical management of this problem.    . Reviewed expectations re: course of current medical issues. . Discussed self-management of symptoms. . Outlined signs and symptoms indicating need for more acute intervention. . Patient verbalized understanding and all questions were answered. Marland Kitchen Health Maintenance issues including appropriate healthy diet, exercise, and smoking avoidance were discussed with patient. . See orders for this visit as documented in the electronic medical record. . Patient received an After Visit Summary.  CMA served as Neurosurgeon during  this visit. History, Physical, and Plan performed by medical provider. The above documentation has been reviewed and is accurate and complete. Meghan Cox, D.O.  Meghan RimaErica Alisabeth Selkirk, DO [place] 08/25/2017

## 2018-03-19 ENCOUNTER — Other Ambulatory Visit: Payer: Self-pay | Admitting: Family Medicine

## 2018-04-16 NOTE — Progress Notes (Addendum)
Subjective:    Meghan Cox is a 67 y.o. female and is here for a comprehensive physical exam.  Health Maintenance Due  Topic Date Due  . PNA vac Low Risk Adult (2 of 2 - PCV13) 04/11/2018     Current Outpatient Medications:  .  atorvastatin (LIPITOR) 20 MG tablet, TAKE 1 TABLET BY MOUTH EVERY DAY, Disp: 90 tablet, Rfl: 2 .  loratadine (CLARITIN) 10 MG tablet, Take 10 mg by mouth daily., Disp: , Rfl:   PMHx, SurgHx, SocialHx, Medications, and Allergies were reviewed in the Visit Navigator and updated as appropriate.   Past Medical History:  Diagnosis Date  . Heart murmur   . Seasonal allergies    Past Surgical History:  Procedure Laterality Date  . HAMMER TOE SURGERY       Relation Age of Onset  . Heart disease Mother   . Hyperlipidemia Mother   . Cancer Father        Prostate  . Hypertension Father   . Hypertension Maternal Grandmother   . Hypertension Maternal Grandfather   . Cancer Paternal Grandmother        Breast  . Hypertension Paternal Grandfather    Social History   Tobacco Use  . Smoking status: Current Every Day Smoker    Packs/day: 0.50    Types: Cigarettes  . Smokeless tobacco: Never Used  Substance Use Topics  . Alcohol use: No  . Drug use: No    Review of Systems:   Pertinent items are noted in the HPI. Otherwise, ROS is negative.  Objective:   BP (!) 146/88   Pulse 84   Temp 98.1 F (36.7 C) (Oral)   Ht 5\' 1"  (1.549 m)   Wt 177 lb 6.4 oz (80.5 kg)   SpO2 97%   BMI 33.52 kg/m   General appearance: alert, cooperative and appears stated age. Head: normocephalic, without obvious abnormality, atraumatic. Neck: no adenopathy, supple, symmetrical, trachea midline; thyroid not enlarged, symmetric, no tenderness/mass/nodules. Lungs: clear to auscultation bilaterally. Heart: regular rate and rhythm Abdomen: soft, non-tender; no masses,  no organomegaly. Extremities: extremities normal, atraumatic, no cyanosis or edema. Skin: skin  color, texture, turgor normal, no rashes or lesions. Lymph: cervical, supraclavicular, and axillary nodes normal; no abnormal inguinal nodes palpated. Neurologic: grossly normal.                                      Assessment/Plan:   Meghan Cox was seen today for annual exam.  Diagnoses and all orders for this visit:  Routine physical examination  Encounter for screening for lipid disorder -     Lipid panel  Encounter for hepatitis C screening test for low risk patient -     Hepatitis C antibody  Obesity (BMI 30-39.9) -     CBC with Differential/Platelet -     Comprehensive metabolic panel  Cigarette nicotine dependence without complication Comments: Precontemplative. I advised patient to quit smoking, and offered support. [place] QUITLINE: 1-800-QUIT-NOW [phone]).   Patient Counseling: [x]    Nutrition: Stressed importance of moderation in sodium/caffeine intake, saturated fat and cholesterol, caloric balance, sufficient intake of fresh fruits, vegetables, fiber, calcium, iron, and 1 mg of folate supplement per day (for females capable of pregnancy).  [x]    Stressed the importance of regular exercise.   [x]    Substance Abuse: Discussed cessation/primary prevention of tobacco, alcohol, or other drug use; driving or other  dangerous activities under the influence; availability of treatment for abuse.   [x]    Injury prevention: Discussed safety belts, safety helmets, smoke detector, smoking near bedding or upholstery.   [x]    Sexuality: Discussed sexually transmitted diseases, partner selection, use of condoms, avoidance of unintended pregnancy  and contraceptive alternatives.  [x]    Dental health: Discussed importance of regular tooth brushing, flossing, and dental visits.  [x]    Health maintenance and immunizations reviewed. Please refer to Health maintenance section.   Helane RimaErica Tiffiney Sparrow, DO [place] [facility]

## 2018-04-17 ENCOUNTER — Ambulatory Visit (INDEPENDENT_AMBULATORY_CARE_PROVIDER_SITE_OTHER): Payer: 59 | Admitting: Family Medicine

## 2018-04-17 ENCOUNTER — Encounter: Payer: Self-pay | Admitting: Family Medicine

## 2018-04-17 VITALS — BP 146/88 | HR 84 | Temp 98.1°F | Ht 61.0 in | Wt 177.4 lb

## 2018-04-17 DIAGNOSIS — F17210 Nicotine dependence, cigarettes, uncomplicated: Secondary | ICD-10-CM | POA: Diagnosis not present

## 2018-04-17 DIAGNOSIS — E669 Obesity, unspecified: Secondary | ICD-10-CM

## 2018-04-17 DIAGNOSIS — Z0000 Encounter for general adult medical examination without abnormal findings: Secondary | ICD-10-CM | POA: Diagnosis not present

## 2018-04-17 DIAGNOSIS — Z13220 Encounter for screening for lipoid disorders: Secondary | ICD-10-CM

## 2018-04-17 DIAGNOSIS — Z1159 Encounter for screening for other viral diseases: Secondary | ICD-10-CM

## 2018-04-17 LAB — LIPID PANEL
Cholesterol: 164 mg/dL (ref 0–200)
HDL: 49.7 mg/dL (ref >39.00)
LDL Cholesterol: 86 mg/dL (ref 0–99)
NonHDL: 114.34
Total CHOL/HDL Ratio: 3
Triglycerides: 141 mg/dL (ref 0.0–149.0)
VLDL: 28.2 mg/dL (ref 0.0–40.0)

## 2018-04-17 LAB — COMPREHENSIVE METABOLIC PANEL
ALT: 14 U/L (ref 0–35)
AST: 14 U/L (ref 0–37)
Albumin: 3.8 g/dL (ref 3.5–5.2)
Alkaline Phosphatase: 85 U/L (ref 39–117)
BUN: 14 mg/dL (ref 6–23)
CO2: 27 mEq/L (ref 19–32)
Calcium: 9.1 mg/dL (ref 8.4–10.5)
Chloride: 106 mEq/L (ref 96–112)
Creatinine, Ser: 0.85 mg/dL (ref 0.40–1.20)
GFR: 71.05 mL/min (ref >60.00)
Glucose, Bld: 90 mg/dL (ref 70–99)
Potassium: 4.3 mEq/L (ref 3.5–5.1)
Sodium: 139 mEq/L (ref 135–145)
Total Bilirubin: 0.5 mg/dL (ref 0.2–1.2)
Total Protein: 6.4 g/dL (ref 6.0–8.3)

## 2018-04-17 LAB — CBC WITH DIFFERENTIAL/PLATELET
Basophils Absolute: 0.1 10*3/uL (ref 0.0–0.1)
Basophils Relative: 0.8 % (ref 0.0–3.0)
Eosinophils Absolute: 0.4 10*3/uL (ref 0.0–0.7)
Eosinophils Relative: 5.9 % — ABNORMAL HIGH (ref 0.0–5.0)
HCT: 38.9 % (ref 36.0–46.0)
Hemoglobin: 12.8 g/dL (ref 12.0–15.0)
Lymphocytes Relative: 23.8 % (ref 12.0–46.0)
Lymphs Abs: 1.8 10*3/uL (ref 0.7–4.0)
MCHC: 32.8 g/dL (ref 30.0–36.0)
MCV: 85.8 fl (ref 78.0–100.0)
Monocytes Absolute: 0.6 10*3/uL (ref 0.1–1.0)
Monocytes Relative: 7.6 % (ref 3.0–12.0)
Neutro Abs: 4.7 10*3/uL (ref 1.4–7.7)
Neutrophils Relative %: 61.9 % (ref 43.0–77.0)
Platelets: 362 10*3/uL (ref 150.0–400.0)
RBC: 4.54 Mil/uL (ref 3.87–5.11)
RDW: 14.9 % (ref 11.5–15.5)
WBC: 7.6 10*3/uL (ref 4.0–10.5)

## 2018-04-17 NOTE — Patient Instructions (Signed)
Check into the Low dose CT for lung cancer screening.

## 2018-04-18 LAB — HEPATITIS C ANTIBODY
Hepatitis C Ab: NONREACTIVE
SIGNAL TO CUT-OFF: 0.01 (ref <1.00)

## 2018-04-23 ENCOUNTER — Encounter: Payer: Self-pay | Admitting: Family Medicine

## 2018-06-23 ENCOUNTER — Ambulatory Visit (INDEPENDENT_AMBULATORY_CARE_PROVIDER_SITE_OTHER): Payer: 59

## 2018-06-23 ENCOUNTER — Encounter: Payer: Self-pay | Admitting: Sports Medicine

## 2018-06-23 ENCOUNTER — Ambulatory Visit (INDEPENDENT_AMBULATORY_CARE_PROVIDER_SITE_OTHER): Payer: 59 | Admitting: Sports Medicine

## 2018-06-23 VITALS — BP 148/90 | HR 74 | Ht 61.0 in | Wt 179.6 lb

## 2018-06-23 DIAGNOSIS — M5410 Radiculopathy, site unspecified: Secondary | ICD-10-CM | POA: Diagnosis not present

## 2018-06-23 DIAGNOSIS — M79604 Pain in right leg: Secondary | ICD-10-CM

## 2018-06-23 DIAGNOSIS — M4727 Other spondylosis with radiculopathy, lumbosacral region: Secondary | ICD-10-CM | POA: Diagnosis not present

## 2018-06-23 MED ORDER — PREDNISONE 50 MG PO TABS: 50.0000 mg | ORAL_TABLET | Freq: Every day | ORAL | 0 refills | Status: AC

## 2018-06-23 NOTE — Patient Instructions (Addendum)
Please perform the exercise program that we have prepared for you and gone over in detail on a daily basis.  In addition to the handout you were provided you can access your program through: [email or web address]   Your unique program code is:  [email or web address]   Also check out [employer]"Foundation Training" which is a program developed by Dr. Myles LippsEric Goodman.   There are links to a couple of his YouTube Videos below and I would like to see performing one of his videos 5-6 days per week.    A good intro video is: "Independence from Pain 7-minute Video" - [email or web address]   Exercises that focus more on the neck are as below: Dr. Derrill KayGoodman with [employer] [facility] [employer] teaching neck and shoulder details Part 1 - [email or web address] Part 2 Dr. Derrill KayGoodman with [facility] quick routine to practice daily - [email or web address]  Do not try to attempt the entire video when first beginning.    Try breaking of each exercise that he goes into shorter segments.  Otherwise if they perform an exercise for 45 seconds, start with 15 seconds and rest and then resume when they begin the new activity.  If you work your way up to being able to do these videos without having to stop, I expect you will see significant improvements in your pain.  If you enjoy his videos and would like to find out more you can look on his website: [email or web address].  He has a workout streaming option as well as a DVD set available for purchase.  Amazon has the best price for his DVDs.

## 2018-06-23 NOTE — Progress Notes (Signed)
Meghan FellsMichael D. Delorise Shinerigby, DO  [facility] [place] [phone]  Meghan RutterJanice Marie Cox - 67 y.o. female MRN [id number]  Date of birth: 20-Dec-1950  Visit Date: 06/23/2018  PCP: Meghan Cox, Erica, DO   Referred by: Meghan Cox, Erica, DO  Scribe(s) for today's visit: Meghan FabianMolly Cox, LAT, ATC  SUBJECTIVE:  Meghan Cox is here for Initial Assessment (R leg pain) .    Her R leg pain symptoms INITIALLY: Began a few months ago w/ no known MOI.  She states that she feels pain and pulling along the outside of her R thigh when she wakes which typically eases as she moves around more.  She states that she is also getting pain in her R post thigh and lower leg. Described as moderate+ spasm-like pain, radiating to the R post LE Worsened with forward flexion Improved with piriformis stretching Additional associated symptoms include: N/T noted along the R lateral foot over the past few weeks; no mechanical symptoms noted    At this time symptoms show no change compared to onset  She has been taking muscle relaxers and an anti-inflammatory which is not helping as much.  She has been trying some of her HEP that we gave her for a similar condition on her L side.   REVIEW OF SYSTEMS: Reports night time disturbances. Denies fevers, chills, or night sweats. Denies unexplained weight loss. Denies personal history of cancer. Denies changes in bowel or bladder habits. Denies recent unreported falls. Denies new or worsening dyspnea or wheezing. Denies headaches or dizziness.  Reports numbness, tingling or weakness  In the extremities - in the L foot Denies dizziness or presyncopal episodes Denies lower extremity edema     HISTORY & PERTINENT PRIOR DATA:  Significant/pertinent history, findings, studies include:  reports that she has been smoking cigarettes. She has been smoking about 0.50 packs per day. She has never used smokeless tobacco. No results for input(s):  HGBA1C, LABURIC, CREATINE in the last 8760 hours. No specialty comments available. No problems updated.  Otherwise prior history reviewed and updated per electronic medical record.   OBJECTIVE:  VS:  HT:5\' 1"  (154.9 cm)   WT:179 lb 9.6 oz (81.5 kg)  BMI:33.95    BP:(Abnormal) 148/90  HR:74bpm  TEMP: ( )  RESP:96 %   PHYSICAL EXAM: CONSTITUTIONAL: Well-developed, Well-nourished and In no acute distress Alert & appropriately interactive. and Not depressed or anxious appearing. RESPIRATORY: No increased work of breathing and Trachea Midline EYES: Pupils are equal., EOM intact without nystagmus. and No scleral icterus.  Lower extremities: Warm and well perfused NEURO: unremarkable Normal associated myotomal distribution strength to manual muscle testing Normal sensation to light touch Normal and symmetric associated DTRs  MSK Exam: BACK Exam: Normal alignment & Contours Skin: No overlying erythema/ecchymosis  MOTOR TESTING: Intact in all LE myotomes and Able to heel and toe walk without difficutly        RIGHT    LEFT Straight leg raise-------------------------: normal, no pain                         normal, no pain Braggard Stretch Test------------------: normal, no pain                         normal, no pain Slump Sign--------------------------------: positive, mild pain  positive, mild pain Popliteal compression test------------: positive, mild pain                         positive, mild pain Greater sciatic notch tenderness----: normal, no pain                         normal, no pain   REFLEXES Right Left  DTR - L3/4 -Patellar 2+ 2+  DTR - L5/S1 - Achilles 2+ 2+     PROCEDURES & DATA REVIEWED:  . Discussed the foundation of treatment for this condition is physical therapy and/or daily (5-6 days/week) therapeutic exercises, focusing on core strengthening, coordination, neuromuscular control/reeducation.  Therapeutic exercises prescribed  per procedure note. . X-Rays obtained today and reviewed with the patient that showed: Multilevel degenerative changes of the lumbar spine.  ASSESSMENT   1. Radicular leg pain   2. Pain of right lower extremity   3. Osteoarthritis of spine with radiculopathy, lumbosacral region     PLAN:       . Links to [employer] provided today per Patient Instructions.  These exercises were developed by Meghan Cox, DC with a strong emphasis on core neuromuscular reducation and postural realignment through body-weight exercises. . If any lack of improvement consider further diagnostic evaluation with Further diagnostic evaluation of lumbar spine with MRI. Marland Kitchen Symptoms are only mild at this time.  We will provide her a prednisone burst to be taken if any lack of improvement. . HEP per procedure note . If any worsening symptoms or red flag features she will follow-up otherwise as needed. No problem-specific Assessment & Plan notes found for this encounter.   Follow-up: Return if symptoms worsen or fail to improve.      Please see additional documentation for Objective, Assessment and Plan sections. Pertinent additional documentation may be included in corresponding procedure notes, imaging studies, problem based documentation and patient instructions. Please see these sections of the encounter for additional information regarding this visit.  CMA/ATC served as Neurosurgeon during this visit. History, Physical, and Plan performed by medical provider. Documentation and orders reviewed and attested to.      Meghan Mews, DO    [facility] Phys[facility]

## 2018-06-23 NOTE — Progress Notes (Signed)
PROCEDURE NOTE: THERAPEUTIC EXERCISES (97110) 15 minutes spent for Therapeutic exercises as below and as referenced in the AVS.  This included exercises focusing on stretching, strengthening, with significant focus on eccentric aspects.   Proper technique shown and discussed handout in great detail with ATC.  All questions were discussed and answered.   Long term goals include an improvement in range of motion, strength, endurance as well as avoiding reinjury. Frequency of visits is one time as determined during today's  office visit. Frequency of exercises to be performed is as per handout.  EXERCISES REVIEWED:  Derrill KayGoodman Exercises  Thoracic Mobility  Hip ABduction strengthening with focus on Glute Medius Recruitment  Glute stretching and recruitment.

## 2018-06-28 ENCOUNTER — Encounter: Payer: Self-pay | Admitting: Sports Medicine

## 2019-03-11 ENCOUNTER — Other Ambulatory Visit: Payer: Self-pay | Admitting: Family Medicine

## 2019-03-12 NOTE — Telephone Encounter (Signed)
Last OV 04/17/2018 Last refill 03/19/18 #90/2 Next OV 04/23/2019

## 2019-04-23 ENCOUNTER — Encounter: Payer: 59 | Admitting: Family Medicine

## 2019-05-13 ENCOUNTER — Other Ambulatory Visit: Payer: Self-pay | Admitting: Family Medicine

## 2019-05-17 NOTE — Telephone Encounter (Signed)
Last OV 04/17/18 Last refill 03/12/19 #30/1 Next OV 07/21/19  Forwarding to Dr. Juleen China as pt has not been seen in over a year.

## 2019-07-21 ENCOUNTER — Other Ambulatory Visit: Payer: Self-pay

## 2019-07-21 ENCOUNTER — Encounter: Payer: Self-pay | Admitting: Family Medicine

## 2019-07-21 ENCOUNTER — Ambulatory Visit (INDEPENDENT_AMBULATORY_CARE_PROVIDER_SITE_OTHER): Payer: 59 | Admitting: Family Medicine

## 2019-07-21 VITALS — BP 136/68 | HR 73 | Temp 97.6°F | Ht 61.0 in | Wt 166.8 lb

## 2019-07-21 DIAGNOSIS — E781 Pure hyperglyceridemia: Secondary | ICD-10-CM | POA: Insufficient documentation

## 2019-07-21 DIAGNOSIS — Z0000 Encounter for general adult medical examination without abnormal findings: Secondary | ICD-10-CM

## 2019-07-21 DIAGNOSIS — Z23 Encounter for immunization: Secondary | ICD-10-CM

## 2019-07-21 DIAGNOSIS — F17210 Nicotine dependence, cigarettes, uncomplicated: Secondary | ICD-10-CM

## 2019-07-21 DIAGNOSIS — E669 Obesity, unspecified: Secondary | ICD-10-CM

## 2019-07-21 DIAGNOSIS — E559 Vitamin D deficiency, unspecified: Secondary | ICD-10-CM

## 2019-07-21 LAB — CBC WITH DIFFERENTIAL/PLATELET
Basophils Absolute: 0.1 10*3/uL (ref 0.0–0.1)
Basophils Relative: 0.8 % (ref 0.0–3.0)
Eosinophils Absolute: 0.3 10*3/uL (ref 0.0–0.7)
Eosinophils Relative: 3.8 % (ref 0.0–5.0)
HCT: 44.9 % (ref 36.0–46.0)
Hemoglobin: 14.7 g/dL (ref 12.0–15.0)
Lymphocytes Relative: 22.8 % (ref 12.0–46.0)
Lymphs Abs: 1.9 10*3/uL (ref 0.7–4.0)
MCHC: 32.8 g/dL (ref 30.0–36.0)
MCV: 85.6 fl (ref 78.0–100.0)
Monocytes Absolute: 0.5 10*3/uL (ref 0.1–1.0)
Monocytes Relative: 6.5 % (ref 3.0–12.0)
Neutro Abs: 5.4 10*3/uL (ref 1.4–7.7)
Neutrophils Relative %: 66.1 % (ref 43.0–77.0)
Platelets: 325 10*3/uL (ref 150.0–400.0)
RBC: 5.24 Mil/uL — ABNORMAL HIGH (ref 3.87–5.11)
RDW: 14.7 % (ref 11.5–15.5)
WBC: 8.2 10*3/uL (ref 4.0–10.5)

## 2019-07-21 LAB — COMPREHENSIVE METABOLIC PANEL
ALT: 17 U/L (ref 0–35)
AST: 13 U/L (ref 0–37)
Albumin: 3.8 g/dL (ref 3.5–5.2)
Alkaline Phosphatase: 93 U/L (ref 39–117)
BUN: 10 mg/dL (ref 6–23)
CO2: 26 mEq/L (ref 19–32)
Calcium: 9.2 mg/dL (ref 8.4–10.5)
Chloride: 103 mEq/L (ref 96–112)
Creatinine, Ser: 0.72 mg/dL (ref 0.40–1.20)
GFR: 80.65 mL/min (ref >60.00)
Glucose, Bld: 94 mg/dL (ref 70–99)
Potassium: 4.1 mEq/L (ref 3.5–5.1)
Sodium: 139 mEq/L (ref 135–145)
Total Bilirubin: 0.6 mg/dL (ref 0.2–1.2)
Total Protein: 6.4 g/dL (ref 6.0–8.3)

## 2019-07-21 LAB — LIPID PANEL
Cholesterol: 155 mg/dL (ref 0–200)
HDL: 46.9 mg/dL (ref >39.00)
LDL Cholesterol: 79 mg/dL (ref 0–99)
NonHDL: 108.59
Total CHOL/HDL Ratio: 3
Triglycerides: 149 mg/dL (ref 0.0–149.0)
VLDL: 29.8 mg/dL (ref 0.0–40.0)

## 2019-07-21 LAB — VITAMIN D 25 HYDROXY (VIT D DEFICIENCY, FRACTURES): VITD: 12.5 ng/mL — ABNORMAL LOW (ref 30.00–100.00)

## 2019-07-21 MED ORDER — ATORVASTATIN CALCIUM 20 MG PO TABS: 20.0000 mg | ORAL_TABLET | Freq: Every day | ORAL | 2 refills | Status: DC

## 2019-07-21 NOTE — Patient Instructions (Signed)

## 2019-07-21 NOTE — Progress Notes (Signed)
Subjective:    Meghan Cox is a 68 y.o. female and is here for a comprehensive physical exam.  Doing well.  Has lost 20 pounds since working from home as she has limited access to junk food there.  She has been getting outside more often.  Still smoking but has cut down from 3 packs/day to 2 packs/day.  She actually smokes about half of each of those cigarettes so are estimated equivalent is 1 pack/day.  She is open to low-dose CT for lung cancer screening in the next year.  Taking simvastatin and tolerating without myalgias.  With weight loss, arthritis has improved.  Health Maintenance Due  Topic Date Due  . MAMMOGRAM  04/11/2018  . PNA vac Low Risk Adult (2 of 2 - PCV13) 04/11/2018  . INFLUENZA VACCINE  05/29/2019     Current Outpatient Medications:  .  atorvastatin (LIPITOR) 20 MG tablet, Take 1 tablet (20 mg total) by mouth daily., Disp: 90 tablet, Rfl: 2 .  loratadine (CLARITIN) 10 MG tablet, Take 10 mg by mouth daily., Disp: , Rfl:   PMHx, SurgHx, SocialHx, Medications, and Allergies were reviewed in the Visit Navigator and updated as appropriate.   Past Medical History:  Diagnosis Date  . Heart murmur   . Seasonal allergies      Past Surgical History:  Procedure Laterality Date  . HAMMER TOE SURGERY       Family History  Problem Relation Age of Onset  . Heart disease Mother   . Hyperlipidemia Mother   . Cancer Father        Prostate  . Hypertension Father   . Hypertension Maternal Grandmother   . Hypertension Maternal Grandfather   . Cancer Paternal Grandmother        Breast  . Hypertension Paternal Grandfather     Social History   Tobacco Use  . Smoking status: Current Every Day Smoker    Packs/day: 0.50    Types: Cigarettes  . Smokeless tobacco: Never Used  Substance Use Topics  . Alcohol use: No  . Drug use: No    Review of Systems:   Pertinent items are noted in the HPI. Otherwise, ROS is negative.  Objective:   BP 136/68   Pulse  73   Temp 97.6 F (36.4 C) (Temporal)   Ht 5\' 1"  (1.549 m)   Wt 166 lb 12.8 oz (75.7 kg)   SpO2 97%   BMI 31.52 kg/m   General appearance: alert, cooperative and appears stated age. Head: normocephalic, without obvious abnormality, atraumatic. Neck: no adenopathy, supple, symmetrical, trachea midline; thyroid not enlarged, symmetric, no tenderness/mass/nodules. Lungs: clear to auscultation bilaterally. Heart: regular rate and rhythm Abdomen: soft, non-tender; no masses,  no organomegaly. Extremities: extremities normal, atraumatic, no cyanosis or edema. Skin: skin color, texture, turgor normal, no rashes or lesions. Lymph: cervical, supraclavicular, and axillary nodes normal; no abnormal inguinal nodes palpated. Neurologic: grossly normal.                                      Assessment/Plan:   [name] was seen today for annual exam.  Diagnoses and all orders for this visit:  Routine physical examination  Obesity (BMI 30-39.9)  Cigarette nicotine dependence without complication -     CBC with Differential/Platelet -     Ambulatory referral to Pulmonology  Pure hypertriglyceridemia -     Comprehensive metabolic panel -  Lipid panel -     atorvastatin (LIPITOR) 20 MG tablet; Take 1 tablet (20 mg total) by mouth daily.  Vitamin D deficiency -     VITAMIN D 25 Hydroxy (Vit-D Deficiency, Fractures)    Patient Counseling: [x]    Nutrition: Stressed importance of moderation in sodium/caffeine intake, saturated fat and cholesterol, caloric balance, sufficient intake of fresh fruits, vegetables, fiber, calcium, iron, and 1 mg of folate supplement per day (for females capable of pregnancy).  [x]    Stressed the importance of regular exercise.   [x]    Substance Abuse: Discussed cessation/primary prevention of tobacco, alcohol, or other drug use; driving or other dangerous activities under the influence; availability of treatment for abuse.   [x]    Injury prevention: Discussed  safety belts, safety helmets, smoke detector, smoking near bedding or upholstery.   [x]    Sexuality: Discussed sexually transmitted diseases, partner selection, use of condoms, avoidance of unintended pregnancy  and contraceptive alternatives.  [x]    Dental health: Discussed importance of regular tooth brushing, flossing, and dental visits.  [x]    Health maintenance and immunizations reviewed. Please refer to Health maintenance section.   Briscoe Deutscher, DO [place]

## 2019-07-26 ENCOUNTER — Other Ambulatory Visit: Payer: Self-pay

## 2019-07-26 DIAGNOSIS — F17210 Nicotine dependence, cigarettes, uncomplicated: Secondary | ICD-10-CM

## 2020-07-24 ENCOUNTER — Encounter: Payer: 59 | Admitting: Family Medicine

## 2020-08-03 ENCOUNTER — Other Ambulatory Visit: Payer: Self-pay | Admitting: Family Medicine

## 2020-08-03 DIAGNOSIS — E781 Pure hyperglyceridemia: Secondary | ICD-10-CM

## 2020-08-25 ENCOUNTER — Other Ambulatory Visit: Payer: Self-pay

## 2020-08-25 ENCOUNTER — Encounter: Payer: Self-pay | Admitting: Family Medicine

## 2020-08-25 ENCOUNTER — Ambulatory Visit (INDEPENDENT_AMBULATORY_CARE_PROVIDER_SITE_OTHER): Payer: 59 | Admitting: Family Medicine

## 2020-08-25 VITALS — BP 140/78 | HR 84 | Temp 97.9°F | Ht 61.0 in | Wt 158.8 lb

## 2020-08-25 DIAGNOSIS — Z0000 Encounter for general adult medical examination without abnormal findings: Secondary | ICD-10-CM

## 2020-08-25 DIAGNOSIS — Z1231 Encounter for screening mammogram for malignant neoplasm of breast: Secondary | ICD-10-CM | POA: Diagnosis not present

## 2020-08-25 DIAGNOSIS — R030 Elevated blood-pressure reading, without diagnosis of hypertension: Secondary | ICD-10-CM

## 2020-08-25 DIAGNOSIS — J309 Allergic rhinitis, unspecified: Secondary | ICD-10-CM | POA: Diagnosis not present

## 2020-08-25 DIAGNOSIS — E785 Hyperlipidemia, unspecified: Secondary | ICD-10-CM | POA: Diagnosis not present

## 2020-08-25 DIAGNOSIS — F17210 Nicotine dependence, cigarettes, uncomplicated: Secondary | ICD-10-CM | POA: Diagnosis not present

## 2020-08-25 DIAGNOSIS — Z23 Encounter for immunization: Secondary | ICD-10-CM

## 2020-08-25 MED ORDER — ATORVASTATIN CALCIUM 20 MG PO TABS
20.0000 mg | ORAL_TABLET | Freq: Every day | ORAL | 3 refills | Status: DC
Start: 2020-08-25 — End: 2021-11-19

## 2020-08-25 NOTE — Patient Instructions (Addendum)
Please return in 12 months for your annual complete physical; please come fasting.  I will release your lab results to you on your MyChart account with further instructions. Please reply with any questions.  Please check your blood pressure outside of the office and send me the numbers. We want it to be < 140/90; 120s/70s ideally.  It was a pleasure meeting you today! Thank you for choosing [place] to meet your healthcare needs! I truly look forward to working with you. If you have any questions or concerns, please send me a message via Mychart or call the office at [phone].  Today you were given your high dose flu and prevnar (pneumonia 13) vaccinations.  I've ordered a mammogram and a lung cancer screening CT due to your long smoking history.   Steps to Quit Smoking Smoking tobacco is the leading cause of preventable death. It can affect almost every organ in the body. Smoking puts you and those around you at risk for developing many serious chronic diseases. Quitting smoking can be difficult, but it is one of the best things that you can do for your health. It is never too late to quit. How do I get ready to quit? When you decide to quit smoking, create a plan to help you succeed. Before you quit:  Pick a date to quit. Set a date within the next 2 weeks to give you time to prepare.  Write down the reasons why you are quitting. Keep this list in places where you will see it often.  Tell your family, friends, and co-workers that you are quitting. Support from your loved ones can make quitting easier.  Talk with your health care provider about your options for quitting smoking.  Find out what treatment options are covered by your health insurance.  Identify people, places, things, and activities that make you want to smoke (triggers). Avoid them. What first steps can I take to quit smoking?  Throw away all cigarettes at home, at work, and in your car.  Throw away smoking accessories,  such as Set designer.  Clean your car. Make sure to empty the ashtray.  Clean your home, including curtains and carpets. What strategies can I use to quit smoking? Talk with your health care provider about combining strategies, such as taking medicines while you are also receiving in-person counseling. Using these two strategies together makes you more likely to succeed in quitting than if you used either strategy on its own.  If you are pregnant or breastfeeding, talk with your health care provider about finding counseling or other support strategies to quit smoking. Do not take medicine to help you quit smoking unless your health care provider tells you to do so. To quit smoking: Quit right away  Quit smoking completely, instead of gradually reducing how much you smoke over a period of time. Research shows that stopping smoking right away is more successful than gradually quitting.  Attend in-person counseling to help you build problem-solving skills. You are more likely to succeed in quitting if you attend counseling sessions regularly. Even short sessions of 10 minutes can be effective. Take medicine You may take medicines to help you quit smoking. Some medicines require a prescription and some you can purchase over-the-counter. Medicines may have nicotine in them to replace the nicotine in cigarettes. Medicines may:  Help to stop cravings.  Help to relieve withdrawal symptoms. Your health care provider may recommend:  Nicotine patches, gum, or lozenges.  Nicotine inhalers or  sprays.  Non-nicotine medicine that is taken by mouth. Find resources Find resources and support systems that can help you to quit smoking and remain smoke-free after you quit. These resources are most helpful when you use them often. They include:  Online chats with a Veterinary surgeon.  Telephone quitlines.  Printed Materials engineer.  Support groups or group counseling.  Text messaging  programs.  Mobile phone apps or applications. Use apps that can help you stick to your quit plan by providing reminders, tips, and encouragement. There are many free apps for mobile devices as well as websites. Examples include Quit Guide from the [employer] and smokefree.gov What things can I do to make it easier to quit?   Reach out to your family and friends for support and encouragement. Call telephone quitlines (1-800-QUIT-NOW), reach out to support groups, or work with a counselor for support.  Ask people who smoke to avoid smoking around you.  Avoid places that trigger you to smoke, such as bars, parties, or smoke-break areas at work.  Spend time with people who do not smoke.  Lessen the stress in your life. Stress can be a smoking trigger for some people. To lessen stress, try: ? Exercising regularly. ? Doing deep-breathing exercises. ? Doing yoga. ? Meditating. ? Performing a body scan. This involves closing your eyes, scanning your body from head to toe, and noticing which parts of your body are particularly tense. Try to relax the muscles in those areas. How will I feel when I quit smoking? Day 1 to 3 weeks Within the first 24 hours of quitting smoking, you may start to feel withdrawal symptoms. These symptoms are usually most noticeable 2-3 days after quitting, but they usually do not last for more than 2-3 weeks. You may experience these symptoms:  Mood swings.  Restlessness, anxiety, or irritability.  Trouble concentrating.  Dizziness.  Strong cravings for sugary foods and nicotine.  Mild weight gain.  Constipation.  Nausea.  Coughing or a sore throat.  Changes in how the medicines that you take for unrelated issues work in your body.  Depression.  Trouble sleeping (insomnia). Week 3 and afterward After the first 2-3 weeks of quitting, you may start to notice more positive results, such as:  Improved sense of smell and taste.  Decreased coughing and sore  throat.  Slower heart rate.  Lower blood pressure.  Clearer skin.  The ability to breathe more easily.  Fewer sick days. Quitting smoking can be very challenging. Do not get discouraged if you are not successful the first time. Some people need to make many attempts to quit before they achieve long-term success. Do your best to stick to your quit plan, and talk with your health care provider if you have any questions or concerns. Summary  Smoking tobacco is the leading cause of preventable death. Quitting smoking is one of the best things that you can do for your health.  When you decide to quit smoking, create a plan to help you succeed.  Quit smoking right away, not slowly over a period of time.  When you start quitting, seek help from your health care provider, family, or friends. This information is not intended to replace advice given to you by your health care provider. Make sure you discuss any questions you have with your health care provider. Document Revised: 07/09/2019 Document Reviewed: 01/02/2019 Elsevier Patient Education  2020 [employer].

## 2020-08-25 NOTE — Addendum Note (Signed)
Addended by: Laddie Aquas A on: 08/25/2020 12:48 PM   Modules accepted: Orders

## 2020-08-25 NOTE — Progress Notes (Signed)
Subjective  Chief Complaint  Patient presents with  . Transitions Of Care  . Annual Exam    fasting, needing statin meds refills  . Health Maintenance    flu shot given in office today, [facility]    HPI: Meghan Cox is a 69 y.o. female who presents to [facility] [place] today for a Female Wellness Visit. She also has the concerns and/or needs as listed above in the chief complaint. These will be addressed in addition to the Health Maintenance Visit.   Wellness Visit: annual visit with health maintenance review and exam without Pap   HM: due mammogram. Due lung cancer screen - still smoking although down to 3 pak / week from 3ppd! Denies copd sxs. Works full time. Married, no children. Originally [place], [place] Chronic disease f/u and/or acute problem visit: (deemed necessary to be done in addition to the wellness visit):  HLD on statin "for years". Needs refills. No AEs.   No h/o HTN although bp is elevated today. No CAD or dm. No premature CAD in family. Nervous to be here today. ? White coat  Allergies on claritin.   Assessment  1. Annual physical exam   2. Cigarette nicotine dependence without complication   3. Dyslipidemia   4. Elevated BP without diagnosis of hypertension   5. Chronic allergic rhinitis   6. Encounter for screening mammogram for breast cancer      Plan  Female Wellness Visit:  Age appropriate Health Maintenance and Prevention measures were discussed with patient. Included topics are cancer screening recommendations, ways to keep healthy (see AVS) including dietary and exercise recommendations, regular eye and dental care, use of seat belts, and avoidance of moderate alcohol use and tobacco use. Mamm and lung ca screen ordered  BMI: discussed patient's BMI and encouraged positive lifestyle modifications to help get to or maintain a target BMI.  HM needs and immunizations were addressed  and ordered. See below for orders. See HM and immunization section for updates. Flu and prevnar today  Routine labs and screening tests ordered including cmp, cbc and lipids where appropriate.  Discussed recommendations regarding Vit D and calcium supplementation (see AVS)  Chronic disease management visit and/or acute problem visit:  Smoking cessation: precontemplative but getting ready. See AVS. Counseling done.  Elevated BP: start home bp monitoring and return if remains elevated  Lipids: recheck on statin refilled.   Allergies are controlled.    Follow up: Return in about 1 year (around 08/25/2021) for complete physical.  Orders Placed This Encounter  Procedures  . MM DIGITAL SCREENING BILATERAL  . CBC with Differential/Platelet  . COMPLETE METABOLIC PANEL WITH GFR  . Lipid panel  . TSH  . Ambulatory Referral for Lung Cancer Scre   Meds ordered this encounter  Medications  . atorvastatin (LIPITOR) 20 MG tablet    Sig: Take 1 tablet (20 mg total) by mouth daily.    Dispense:  90 tablet    Refill:  3      Lifestyle: Body mass index is 30 kg/m. Wt Readings from Last 3 Encounters:  08/25/20 158 lb 12.8 oz (72 kg)  07/21/19 166 lb 12.8 oz (75.7 kg)  06/23/18 179 lb 9.6 oz (81.5 kg)     Patient Active Problem List   Diagnosis Date Noted  . Chronic allergic rhinitis 08/25/2020  . Pure hypertriglyceridemia 07/21/2019  . Osteoarthritis of spine with radiculopathy, lumbosacral region 06/23/2018  .  Elevated BP without diagnosis of hypertension 07/05/2017    .   Marland Kitchen Obesity (BMI 30-39.9) 07/05/2017  . Cigarette nicotine dependence without complication 07/05/2017   Health Maintenance  Topic Date Due  . MAMMOGRAM  04/11/2017  . PNA vac Low Risk Adult (2 of 2 - PCV13) 04/11/2018  . INFLUENZA VACCINE  05/28/2020  . TETANUS/TDAP  08/28/2022  . COLONOSCOPY  01/07/2026  . DEXA SCAN  Completed  . COVID-19 Vaccine  Completed  . Hepatitis C Screening  Completed    Immunization History  Administered Date(s) Administered  . Fluad Quad(high Dose 65+) 07/21/2019  . Influenza Whole 09/12/2018  . PFIZER SARS-COV-2 Vaccination 01/11/2020, 02/01/2020  . Pneumococcal Polysaccharide-23 08/28/2012, 04/11/2017  . Tdap 08/28/2012  . Zoster 11/30/2012   We updated and reviewed the patient's past history in detail and it is documented below. Allergies: Patient is allergic to other. Past Medical History Patient  has a past medical history of Heart murmur and Seasonal allergies. Past Surgical History Patient  has a past surgical history that includes Hammer toe surgery. Family History: Patient family history includes Cancer in her father and paternal grandmother; Heart disease in her mother; Hyperlipidemia in her mother; Hypertension in her father, maternal grandfather, maternal grandmother, and paternal grandfather. Social History:  Patient  reports that she has been smoking cigarettes. She has been smoking about 0.50 packs per day. She has never used smokeless tobacco. She reports that she does not drink alcohol and does not use drugs.  Review of Systems: Constitutional: negative for fever or malaise Ophthalmic: negative for photophobia, double vision or loss of vision Cardiovascular: negative for chest pain, dyspnea on exertion, or new LE swelling Respiratory: negative for SOB or persistent cough Gastrointestinal: negative for abdominal pain, change in bowel habits or melena Genitourinary: negative for dysuria or gross hematuria, no abnormal uterine bleeding or disharge Musculoskeletal: negative for new gait disturbance or muscular weakness Integumentary: negative for new or persistent rashes, no breast lumps Neurological: negative for TIA or stroke symptoms Psychiatric: negative for SI or delusions Allergic/Immunologic: negative for hives  Patient Care Team    Relationship Specialty Notifications Start End  Willow Ora, MD PCP - General Family  Medicine  08/25/20     Objective  Vitals: BP 140/78   Pulse 84   Temp 97.9 F (36.6 C) (Temporal)   Ht 5\' 1"  (1.549 m)   Wt 158 lb 12.8 oz (72 kg)   SpO2 96%   BMI 30.00 kg/m  General:  Well developed, well nourished, no acute distress  Psych:  Alert and orientedx3,normal mood and affect HEENT:  Normocephalic, atraumatic, non-icteric sclera,  supple neck without adenopathy, mass or thyromegaly Cardiovascular:  Normal S1, S2, RRR without gallop, rub or murmur Respiratory:  Good breath sounds bilaterally, CTAB with normal respiratory effort Gastrointestinal: normal bowel sounds, soft, non-tender, no noted masses. No HSM MSK: no deformities, contusions. Joints are without erythema or swelling.  Skin:  Warm, no rashes or suspicious lesions noted Neurologic:    Mental status is normal. CN 2-11 are normal. Gross motor and sensory exams are normal. Normal gait. No tremor Breast Exam: No mass, skin retraction or nipple discharge is appreciated in either breast. No axillary adenopathy. Fibrocystic changes are not noted    Commons side effects, risks, benefits, and alternatives for medications and treatment plan prescribed today were discussed, and the patient expressed understanding of the given instructions. Patient is instructed to call or message via MyChart if he/she has any questions or  concerns regarding our treatment plan. No barriers to understanding were identified. We discussed Red Flag symptoms and signs in detail. Patient expressed understanding regarding what to do in case of urgent or emergency type symptoms.   Medication list was reconciled, printed and provided to the patient in AVS. Patient instructions and summary information was reviewed with the patient as documented in the AVS. This note was prepared with assistance of Dragon voice recognition software. Occasional wrong-word or sound-a-like substitutions may have occurred due to the inherent limitations of voice recognition  software  This visit occurred during the SARS-CoV-2 public health emergency.  Safety protocols were in place, including screening questions prior to the visit, additional usage of staff PPE, and extensive cleaning of exam room while observing appropriate contact time as indicated for disinfecting solutions.

## 2020-08-26 LAB — COMPLETE METABOLIC PANEL WITH GFR
AG Ratio: 1.5 (calc) (ref 1.0–2.5)
ALT: 20 U/L (ref 6–29)
AST: 15 U/L (ref 10–35)
Albumin: 3.8 g/dL (ref 3.6–5.1)
Alkaline phosphatase (APISO): 90 U/L (ref 37–153)
BUN: 16 mg/dL (ref 7–25)
CO2: 24 mmol/L (ref 20–32)
Calcium: 9.1 mg/dL (ref 8.6–10.4)
Chloride: 109 mmol/L (ref 98–110)
Creat: 0.72 mg/dL (ref 0.50–0.99)
GFR, Est African American: 100 mL/min/{1.73_m2} (ref >=60)
GFR, Est Non African American: 86 mL/min/{1.73_m2} (ref >=60)
Globulin: 2.5 g/dL (calc) (ref 1.9–3.7)
Glucose, Bld: 92 mg/dL (ref 65–99)
Potassium: 4.7 mmol/L (ref 3.5–5.3)
Sodium: 142 mmol/L (ref 135–146)
Total Bilirubin: 0.4 mg/dL (ref 0.2–1.2)
Total Protein: 6.3 g/dL (ref 6.1–8.1)

## 2020-08-26 LAB — CBC WITH DIFFERENTIAL/PLATELET
Absolute Monocytes: 503 cells/uL (ref 200–950)
Basophils Absolute: 38 cells/uL (ref 0–200)
Basophils Relative: 0.5 %
Eosinophils Absolute: 458 cells/uL (ref 15–500)
Eosinophils Relative: 6.1 %
HCT: 44 % (ref 35.0–45.0)
Hemoglobin: 14.5 g/dL (ref 11.7–15.5)
Lymphs Abs: 1538 cells/uL (ref 850–3900)
MCH: 28.3 pg (ref 27.0–33.0)
MCHC: 33 g/dL (ref 32.0–36.0)
MCV: 85.9 fL (ref 80.0–100.0)
MPV: 8.8 fL (ref 7.5–12.5)
Monocytes Relative: 6.7 %
Neutro Abs: 4965 cells/uL (ref 1500–7800)
Neutrophils Relative %: 66.2 %
Platelets: 343 10*3/uL (ref 140–400)
RBC: 5.12 10*6/uL — ABNORMAL HIGH (ref 3.80–5.10)
RDW: 13.4 % (ref 11.0–15.0)
Total Lymphocyte: 20.5 %
WBC: 7.5 10*3/uL (ref 3.8–10.8)

## 2020-08-26 LAB — LIPID PANEL
Cholesterol: 174 mg/dL (ref <200)
HDL: 56 mg/dL (ref >=50)
LDL Cholesterol (Calc): 93 mg/dL (calc)
Non-HDL Cholesterol (Calc): 118 mg/dL (calc) (ref <130)
Total CHOL/HDL Ratio: 3.1 (calc) (ref <5.0)
Triglycerides: 158 mg/dL — ABNORMAL HIGH (ref <150)

## 2020-08-26 LAB — TSH: TSH: 2.65 mIU/L (ref 0.40–4.50)

## 2020-09-20 ENCOUNTER — Other Ambulatory Visit: Payer: Self-pay | Admitting: *Deleted

## 2020-09-20 DIAGNOSIS — F17210 Nicotine dependence, cigarettes, uncomplicated: Secondary | ICD-10-CM

## 2020-09-20 DIAGNOSIS — Z87891 Personal history of nicotine dependence: Secondary | ICD-10-CM

## 2020-09-20 NOTE — Progress Notes (Signed)
hest  

## 2020-10-31 ENCOUNTER — Telehealth: Payer: Self-pay | Admitting: Primary Care

## 2020-10-31 NOTE — Telephone Encounter (Signed)
Forwarding to lung nodule pool as I'm unsure when patient needs to follow up.  Thanks!

## 2020-10-31 NOTE — Telephone Encounter (Signed)
Spoke with pt and rescheduled shared decision making visit to 12/15/20 9:00 CT will be rescheduled Pt verbalized understanding. Nothing further needed at this time.

## 2020-11-03 ENCOUNTER — Ambulatory Visit: Payer: 59

## 2020-11-03 ENCOUNTER — Encounter: Payer: 59 | Admitting: Primary Care

## 2020-11-17 ENCOUNTER — Ambulatory Visit: Payer: 59 | Admitting: Family Medicine

## 2020-12-06 ENCOUNTER — Ambulatory Visit: Payer: 59 | Admitting: Family Medicine

## 2020-12-15 ENCOUNTER — Encounter: Payer: 59 | Admitting: Primary Care

## 2020-12-15 ENCOUNTER — Inpatient Hospital Stay: Admission: RE | Admit: 2020-12-15 | Payer: 59 | Source: Ambulatory Visit

## 2021-01-03 ENCOUNTER — Other Ambulatory Visit: Payer: Self-pay

## 2021-01-03 ENCOUNTER — Ambulatory Visit (INDEPENDENT_AMBULATORY_CARE_PROVIDER_SITE_OTHER): Payer: 59 | Admitting: Family Medicine

## 2021-01-03 ENCOUNTER — Encounter: Payer: Self-pay | Admitting: Family Medicine

## 2021-01-03 VITALS — BP 120/80 | HR 87 | Temp 97.2°F | Wt 160.0 lb

## 2021-01-03 DIAGNOSIS — Z780 Asymptomatic menopausal state: Secondary | ICD-10-CM | POA: Diagnosis not present

## 2021-01-03 DIAGNOSIS — F17210 Nicotine dependence, cigarettes, uncomplicated: Secondary | ICD-10-CM

## 2021-01-03 DIAGNOSIS — R030 Elevated blood-pressure reading, without diagnosis of hypertension: Secondary | ICD-10-CM

## 2021-01-03 DIAGNOSIS — M4727 Other spondylosis with radiculopathy, lumbosacral region: Secondary | ICD-10-CM

## 2021-01-03 NOTE — Patient Instructions (Signed)
Please return in October 2022 for your annual complete physical; please come fasting.  Continue to work on smoking cessation! congrats on your success so far.   If you have any questions or concerns, please don't hesitate to send me a message via MyChart or call the office at [phone]. Thank you for visiting with [place] today! It's our pleasure caring for you.  I have ordered a mammogram and/or bone density for you as we discussed today: [x]   Mammogram  [x]   Bone Density  Please call the office checked below to schedule your appointment:  [x]   [facility]      [address] [place],        [address] Wing         []   [facility]  [address] [place],  [facility]

## 2021-01-03 NOTE — Progress Notes (Signed)
Subjective  CC:  Chief Complaint  Patient presents with  . Gynecologic Exam  . Back Pain    Twisted back when moving garbage can, did take two ES Tylenol that helped with pain    HPI: Meghan Cox is a 70 y.o. female who presents to the office today to address the problems listed above in the chief complaint.  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  Felt acute sharp pain without radiation.  No bowel or bladder dysfunction.  No weakness.  She took 2 extra strength Tylenol and feels better today.  She has had a low back injury and has been through physical therapy in the past.  She knows how to do exercises for this.  Smoking cessation: She has been successful in decreasing her smoking even further.  Now down to 1-1/2 packs/week.  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  Defers adjuvant therapy at this time.  Continues to want to quit on her own.  Has looked into lung cancer screening.  When she gets Medicare she will be eligible for this.  At last visit her blood pressure was elevated.  She has monitored it at home and reports normal readings.  It is normal here in the office today.  No chest pain.  Health maintenance: Patient to schedule her mammogram that was ordered back in October.  She is also due for bone density.  Immunizations are up-to-date.  She is eligible for Shingrix   Assessment  1. Osteoarthritis of spine with radiculopathy, lumbosacral region   2. Elevated BP without diagnosis of hypertension   3. Cigarette nicotine dependence without complication   4. Asymptomatic menopausal state      Plan   Low back pain: Musculoskeletal nature.  Mild strain.  Osteoarthritis.  Tylenol and stretching exercises as needed.  Elevated blood pressure in the past.  Normal.  Continue low-salt diet and monitoring.  Smoking cessation counseling done.  Continue to encourage cessation.  Health maintenance:  Patient to schedule mammogram and bone density.  Will update Shingrix at next visit  Follow up: October for complete physical Visit date not found  Orders Placed This Encounter  Procedures  . DG Bone Density   No orders of the defined types were placed in this encounter.     I reviewed the patients updated PMH, FH, and SocHx.    Patient Active Problem List   Diagnosis Date Noted  . Chronic allergic rhinitis 08/25/2020  . Pure hypertriglyceridemia 07/21/2019  . Osteoarthritis of spine with radiculopathy, lumbosacral region 06/23/2018  . Elevated BP without diagnosis of hypertension 07/05/2017  . Obesity (BMI 30-39.9) 07/05/2017  . Cigarette nicotine dependence without complication 07/05/2017   Current Meds  Medication Sig  . atorvastatin (LIPITOR) 20 MG tablet Take 1 tablet (20 mg total) by mouth daily.  Marland Kitchen loratadine (CLARITIN) 10 MG tablet Take 10 mg by mouth daily.    Allergies: Patient is allergic to other. Family History: Patient family history includes Cancer in her father and paternal grandmother; Heart disease in her mother; Hyperlipidemia in her mother; Hypertension in her father, maternal grandfather, maternal grandmother, and paternal grandfather. Social History:  Patient  reports that she has been smoking cigarettes. She has been smoking about 0.50 packs per day. She has never used smokeless tobacco. She reports that she does not drink alcohol and does not use drugs.  Review of Systems: Constitutional: Negative for fever malaise or  anorexia Cardiovascular: negative for chest pain Respiratory: negative for SOB or persistent cough Gastrointestinal: negative for abdominal pain  Objective  Vitals: BP 120/80   Pulse 87   Temp (!) 97.2 F (36.2 C) (Temporal)   Wt 160 lb (72.6 kg)   SpO2 97%   BMI 30.23 kg/m  General: no acute distress , A&Ox3 Normal heart exam Normal gait      Commons side effects, risks, benefits, and alternatives for medications and  treatment plan prescribed today were discussed, and the patient expressed understanding of the given instructions. Patient is instructed to call or message via MyChart if he/she has any questions or concerns regarding our treatment plan. No barriers to understanding were identified. We discussed Red Flag symptoms and signs in detail. Patient expressed understanding regarding what to do in case of urgent or emergency type symptoms.   Medication list was reconciled, printed and provided to the patient in AVS. Patient instructions and summary information was reviewed with the patient as documented in the AVS. This note was prepared with assistance of Dragon voice recognition software. Occasional wrong-word or sound-a-like substitutions may have occurred due to the inherent limitations of voice recognition software  This visit occurred during the SARS-CoV-2 public health emergency.  Safety protocols were in place, including screening questions prior to the visit, additional usage of staff PPE, and extensive cleaning of exam room while observing appropriate contact time as indicated for disinfecting solutions.

## 2021-06-04 DIAGNOSIS — H25013 Cortical age-related cataract, bilateral: Secondary | ICD-10-CM | POA: Diagnosis not present

## 2021-06-04 DIAGNOSIS — H52203 Unspecified astigmatism, bilateral: Secondary | ICD-10-CM | POA: Diagnosis not present

## 2021-06-04 DIAGNOSIS — H2513 Age-related nuclear cataract, bilateral: Secondary | ICD-10-CM | POA: Diagnosis not present

## 2021-06-04 DIAGNOSIS — H524 Presbyopia: Secondary | ICD-10-CM | POA: Diagnosis not present

## 2021-08-03 ENCOUNTER — Encounter: Payer: 59 | Admitting: Family Medicine

## 2021-08-15 ENCOUNTER — Other Ambulatory Visit: Payer: Self-pay | Admitting: Family Medicine

## 2021-08-15 DIAGNOSIS — Z1231 Encounter for screening mammogram for malignant neoplasm of breast: Secondary | ICD-10-CM

## 2021-08-31 ENCOUNTER — Encounter: Payer: 59 | Admitting: Family Medicine

## 2021-09-13 ENCOUNTER — Ambulatory Visit: Payer: 59

## 2021-10-30 ENCOUNTER — Ambulatory Visit
Admission: RE | Admit: 2021-10-30 | Discharge: 2021-10-30 | Disposition: A | Discharge status: Home / Self Care | Payer: PRIVATE HEALTH INSURANCE | Source: Ambulatory Visit | Attending: Family Medicine | Admitting: Family Medicine

## 2021-10-30 DIAGNOSIS — Z1231 Encounter for screening mammogram for malignant neoplasm of breast: Secondary | ICD-10-CM | POA: Diagnosis not present

## 2021-11-07 ENCOUNTER — Encounter: Payer: 59 | Admitting: Family Medicine

## 2021-11-18 ENCOUNTER — Other Ambulatory Visit: Payer: Self-pay | Admitting: Family Medicine

## 2021-11-26 ENCOUNTER — Other Ambulatory Visit: Payer: Self-pay

## 2021-11-26 ENCOUNTER — Ambulatory Visit (INDEPENDENT_AMBULATORY_CARE_PROVIDER_SITE_OTHER): Payer: Medicare Other | Admitting: Family Medicine

## 2021-11-26 ENCOUNTER — Encounter: Payer: Self-pay | Admitting: Family Medicine

## 2021-11-26 VITALS — BP 149/91 | HR 92 | Temp 98.0°F | Ht 61.0 in | Wt 160.2 lb

## 2021-11-26 DIAGNOSIS — Z0000 Encounter for general adult medical examination without abnormal findings: Secondary | ICD-10-CM | POA: Diagnosis not present

## 2021-11-26 DIAGNOSIS — Z122 Encounter for screening for malignant neoplasm of respiratory organs: Secondary | ICD-10-CM | POA: Diagnosis not present

## 2021-11-26 DIAGNOSIS — Z780 Asymptomatic menopausal state: Secondary | ICD-10-CM | POA: Diagnosis not present

## 2021-11-26 DIAGNOSIS — E782 Mixed hyperlipidemia: Secondary | ICD-10-CM | POA: Diagnosis not present

## 2021-11-26 DIAGNOSIS — J309 Allergic rhinitis, unspecified: Secondary | ICD-10-CM | POA: Diagnosis not present

## 2021-11-26 DIAGNOSIS — R030 Elevated blood-pressure reading, without diagnosis of hypertension: Secondary | ICD-10-CM | POA: Diagnosis not present

## 2021-11-26 DIAGNOSIS — M4727 Other spondylosis with radiculopathy, lumbosacral region: Secondary | ICD-10-CM

## 2021-11-26 DIAGNOSIS — F17210 Nicotine dependence, cigarettes, uncomplicated: Secondary | ICD-10-CM

## 2021-11-26 LAB — CBC WITH DIFFERENTIAL/PLATELET
Basophils Absolute: 0 10*3/uL (ref 0.0–0.1)
Basophils Relative: 0.4 % (ref 0.0–3.0)
Eosinophils Absolute: 0.3 10*3/uL (ref 0.0–0.7)
Eosinophils Relative: 4.3 % (ref 0.0–5.0)
HCT: 43.4 % (ref 36.0–46.0)
Hemoglobin: 14.1 g/dL (ref 12.0–15.0)
Lymphocytes Relative: 24 % (ref 12.0–46.0)
Lymphs Abs: 1.7 10*3/uL (ref 0.7–4.0)
MCHC: 32.5 g/dL (ref 30.0–36.0)
MCV: 85.1 fl (ref 78.0–100.0)
Monocytes Absolute: 0.5 10*3/uL (ref 0.1–1.0)
Monocytes Relative: 7.3 % (ref 3.0–12.0)
Neutro Abs: 4.4 10*3/uL (ref 1.4–7.7)
Neutrophils Relative %: 64 % (ref 43.0–77.0)
Platelets: 319 10*3/uL (ref 150.0–400.0)
RBC: 5.1 Mil/uL (ref 3.87–5.11)
RDW: 14.5 % (ref 11.5–15.5)
WBC: 6.9 10*3/uL (ref 4.0–10.5)

## 2021-11-26 LAB — LIPID PANEL
Cholesterol: 159 mg/dL (ref 0–200)
HDL: 48.2 mg/dL (ref >39.00)
LDL Cholesterol: 78 mg/dL (ref 0–99)
NonHDL: 110.36
Total CHOL/HDL Ratio: 3
Triglycerides: 163 mg/dL — ABNORMAL HIGH (ref 0.0–149.0)
VLDL: 32.6 mg/dL (ref 0.0–40.0)

## 2021-11-26 LAB — COMPREHENSIVE METABOLIC PANEL
ALT: 13 U/L (ref 0–35)
AST: 15 U/L (ref 0–37)
Albumin: 3.9 g/dL (ref 3.5–5.2)
Alkaline Phosphatase: 83 U/L (ref 39–117)
BUN: 14 mg/dL (ref 6–23)
CO2: 24 mEq/L (ref 19–32)
Calcium: 9.1 mg/dL (ref 8.4–10.5)
Chloride: 107 mEq/L (ref 96–112)
Creatinine, Ser: 0.78 mg/dL (ref 0.40–1.20)
GFR: 77.21 mL/min (ref >60.00)
Glucose, Bld: 93 mg/dL (ref 70–99)
Potassium: 4.1 mEq/L (ref 3.5–5.1)
Sodium: 141 mEq/L (ref 135–145)
Total Bilirubin: 0.6 mg/dL (ref 0.2–1.2)
Total Protein: 6.7 g/dL (ref 6.0–8.3)

## 2021-11-26 LAB — TSH: TSH: 4.04 u[IU]/mL (ref 0.35–5.50)

## 2021-11-26 MED ORDER — SHINGRIX 50 MCG/0.5ML IM SUSR: 0.5000 mL | Freq: Once | INTRAMUSCULAR | 0 refills | Status: AC

## 2021-11-26 NOTE — Progress Notes (Signed)
Subjective  Chief Complaint  Patient presents with   Annual Exam   Medication Refill    atorvastatin     HPI: Meghan Cox is a 71 y.o. female who presents to [facility] [place] today for a Female Wellness Visit. She also has the concerns and/or needs as listed above in the chief complaint. These will be addressed in addition to the Health Maintenance Visit.   Wellness Visit: annual visit with health maintenance review and exam without Pap  HM: nl recent mammo. Due dexa. Imms: eligible for shingrix. Retired in April. Feeling good about life.  Chronic disease f/u and/or acute problem visit: (deemed necessary to be done in addition to the wellness visit): Tobacco cessation: continues to work on quitting,. Max 3ppd: now down to 1 pak/week! No sxs of copd. Eligible for lung ca screening.  HLD on lipitor 20: tolerates well. Compliant. Had coffee with cream. Due for recheck. Back pain: resolved. Has OA.  Has h/o white coat htn: checks bp at home intermittently.   Assessment  1. Annual physical exam   2. Cigarette nicotine dependence without complication   3. Osteoarthritis of spine with radiculopathy, lumbosacral region   4. Mixed hyperlipidemia   5. Chronic allergic rhinitis   6. Asymptomatic menopausal state   7. Encounter for screening for lung cancer   8. White coat syndrome without diagnosis of hypertension      Plan  Female Wellness Visit: Age appropriate Health Maintenance and Prevention measures were discussed with patient. Included topics are cancer screening recommendations, ways to keep healthy (see AVS) including dietary and exercise recommendations, regular eye and dental care, use of seat belts, and avoidance of moderate alcohol use and tobacco use. Dexa ordered. Smoking cessation discussed; doing well... goal is cessation. Lung ca screen discussed and ordered BMI: discussed patient's BMI and encouraged positive lifestyle modifications to help get  to or maintain a target BMI. HM needs and immunizations were addressed and ordered. See below for orders. See HM and immunization section for updates. Shingrix discussed and ordered Routine labs and screening tests ordered including cmp, cbc and lipids where appropriate. Discussed recommendations regarding Vit D and calcium supplementation (see AVS)  Chronic disease management visit and/or acute problem visit: HLD: recheck today on lipitor 20. Check lfts Bp: to start home monitoring. Ensure it is normal at home. If not, will f/u here in office. See avs for instructions. OA: stable AR: stable on antihistamine as needed.   Follow up: Return in about 1 year (around 11/26/2022) for complete physical.  Orders Placed This Encounter  Procedures   DG Bone Density   CBC with Differential/Platelet   Comprehensive metabolic panel   Lipid panel   TSH   Ambulatory Referral for Lung Cancer Scre   Meds ordered this encounter  Medications   Zoster Vaccine Adjuvanted [facility]) injection    Sig: Inject 0.5 mLs into the muscle once for 1 dose. Please give 2nd dose 2-6 months after first dose    Dispense:  2 each    Refill:  0      Body mass index is 30.27 kg/m. Wt Readings from Last 3 Encounters:  11/26/21 160 lb 3.2 oz (72.7 kg)  01/03/21 160 lb (72.6 kg)  08/25/20 158 lb 12.8 oz (72 kg)     Patient Active Problem List   Diagnosis Date Noted   Chronic allergic rhinitis 08/25/2020   Osteoarthritis of spine with radiculopathy, lumbosacral region 06/23/2018   White coat syndrome  without diagnosis of hypertension 07/05/2017    .    Obesity (BMI 30-39.9) 07/05/2017   Cigarette nicotine dependence without complication [id number]   Health Maintenance  Topic Date Due   Zoster Vaccines- Shingrix (1 of 2) Never done   DEXA SCAN  12/05/2016   TETANUS/TDAP  08/28/2022   MAMMOGRAM  10/30/2022   COLONOSCOPY (Pts 45-71yrs Insurance coverage will need to be confirmed)  01/07/2026   Pneumonia  Vaccine 47+ Years old  Completed   INFLUENZA VACCINE  Completed   COVID-19 Vaccine  Completed   Hepatitis C Screening  Completed   HPV VACCINES  Aged Out   Immunization History  Administered Date(s) Administered   Fluad Quad(high Dose 65+) 07/21/2019, 08/25/2020   Influenza Whole 09/12/2018   Influenza, High Dose Seasonal PF 08/29/2021   PFIZER(Purple Top)SARS-COV-2 Vaccination 01/11/2020, 02/01/2020, 09/28/2020   Pfizer Covid-19 Vaccine Bivalent Booster 25yrs & up 08/29/2021   Pneumococcal Conjugate-13 08/25/2020   Pneumococcal Polysaccharide-23 08/28/2012, 04/11/2017   Tdap 08/28/2012   Zoster, Live 11/30/2012   We updated and reviewed the patient's past history in detail and it is documented below. Allergies: Patient is allergic to other. Past Medical History Patient  has a past medical history of Heart murmur and Seasonal allergies. Past Surgical History Patient  has a past surgical history that includes Hammer toe surgery. Family History: Patient family history includes Cancer in her father and paternal grandmother; Heart disease in her mother; Hyperlipidemia in her mother; Hypertension in her father, maternal grandfather, maternal grandmother, and paternal grandfather. Social History:  Patient  reports that she has been smoking cigarettes. She has been smoking an average of .5 packs per day. She has never used smokeless tobacco. She reports that she does not drink alcohol and does not use drugs.  Review of Systems: Constitutional: negative for fever or malaise Ophthalmic: negative for photophobia, double vision or loss of vision Cardiovascular: negative for chest pain, dyspnea on exertion, or new LE swelling Respiratory: negative for SOB or persistent cough Gastrointestinal: negative for abdominal pain, change in bowel habits or melena Genitourinary: negative for dysuria or gross hematuria, no abnormal uterine bleeding or disharge Musculoskeletal: negative for new gait  disturbance or muscular weakness Integumentary: negative for new or persistent rashes, no breast lumps Neurological: negative for TIA or stroke symptoms Psychiatric: negative for SI or delusions Allergic/Immunologic: negative for hives  Patient Care Team    Relationship Specialty Notifications Start End  Leamon Arnt, MD PCP - General Family Medicine  08/25/20     Objective  Vitals: BP (!) 149/91 (BP Location: Right Arm, Patient Position: Sitting, Cuff Size: Normal)    Pulse 92    Temp 98 F (36.7 C) (Temporal)    Ht 5\' 1"  (1.549 m)    Wt 160 lb 3.2 oz (72.7 kg)    SpO2 98%    BMI 30.27 kg/m  General:  Well developed, well nourished, no acute distress  Psych:  Alert and orientedx3,normal mood and affect HEENT:  Normocephalic, atraumatic, non-icteric sclera,  supple neck without adenopathy, mass or thyromegaly Cardiovascular:  Normal S1, S2, RRR without gallop, rub or murmur Respiratory:  Good breath sounds bilaterally, CTAB with normal respiratory effort Gastrointestinal: normal bowel sounds, soft, non-tender, no noted masses. No HSM MSK: no deformities, contusions. Joints are without erythema or swelling.  Skin:  Warm, no rashes or suspicious lesions noted Neurologic:    Mental status is normal. Normal gait. No tremor   Commons side effects, risks, benefits, and alternatives for  medications and treatment plan prescribed today were discussed, and the patient expressed understanding of the given instructions. Patient is instructed to call or message via MyChart if he/she has any questions or concerns regarding our treatment plan. No barriers to understanding were identified. We discussed Red Flag symptoms and signs in detail. Patient expressed understanding regarding what to do in case of urgent or emergency type symptoms.  Medication list was reconciled, printed and provided to the patient in AVS. Patient instructions and summary information was reviewed with the patient as documented  in the AVS. This note was prepared with assistance of Dragon voice recognition software. Occasional wrong-word or sound-a-like substitutions may have occurred due to the inherent limitations of voice recognition software  This visit occurred during the SARS-CoV-2 public health emergency.  Safety protocols were in place, including screening questions prior to the visit, additional usage of staff PPE, and extensive cleaning of exam room while observing appropriate contact time as indicated for disinfecting solutions.

## 2021-11-26 NOTE — Patient Instructions (Signed)
Please return in 12 months for your annual complete physical; please come fasting.   I will release your lab results to you on your MyChart account with further instructions. Please reply with any questions.    Monitor your blood pressures at home and log them. Normal is 120s/70s. Please schedule an appointment if readings go above 140/90.   Please take the prescription for Shingrix to the pharmacy so they may administer the vaccinations. Your insurance will then cover the injections.   We will call you to get you set up for lung cancer screening evaluation.   If you have any questions or concerns, please don't hesitate to send me a message via MyChart or call the office at [phone]. Thank you for visiting with [place] today! It's our pleasure caring for you.   I have ordered a mammogram and/or bone density for you as we discussed today: []   Mammogram  [x]   Bone Density  Please call the office checked below to schedule your appointment:  [x]   [facility]      [place], [place]         []   [facility]  [address] [place], [place]

## 2021-12-20 ENCOUNTER — Other Ambulatory Visit: Payer: Self-pay | Admitting: Family Medicine

## 2021-12-20 DIAGNOSIS — Z0000 Encounter for general adult medical examination without abnormal findings: Secondary | ICD-10-CM

## 2021-12-20 DIAGNOSIS — Z780 Asymptomatic menopausal state: Secondary | ICD-10-CM

## 2022-01-03 DIAGNOSIS — M1811 Unilateral primary osteoarthritis of first carpometacarpal joint, right hand: Secondary | ICD-10-CM | POA: Diagnosis not present

## 2022-02-07 DIAGNOSIS — M1811 Unilateral primary osteoarthritis of first carpometacarpal joint, right hand: Secondary | ICD-10-CM | POA: Diagnosis not present

## 2022-02-28 ENCOUNTER — Ambulatory Visit (INDEPENDENT_AMBULATORY_CARE_PROVIDER_SITE_OTHER): Payer: Medicare Other

## 2022-02-28 DIAGNOSIS — Z0000 Encounter for general adult medical examination without abnormal findings: Secondary | ICD-10-CM

## 2022-02-28 NOTE — Patient Instructions (Signed)
Meghan Cox , ?Thank you for taking time to come for your Medicare Wellness Visit. I appreciate your ongoing commitment to your health goals. Please review the following plan we discussed and let me know if I can assist you in the future.  ? ?Screening recommendations/referrals: ?Colonoscopy: Done 01/08/16 repeat every 10 years  ?Mammogram: Done 10/30/21 repeat every year  ?Bone Density: Done 12/05/14 pt scheduled for 05/14/22  ?Recommended yearly ophthalmology/optometry visit for glaucoma screening and checkup ?Recommended yearly dental visit for hygiene and checkup ? ?Vaccinations: ?Influenza vaccine: Done 08/29/21 repeat every year  ?Pneumococcal vaccine: Up to date ?Tdap vaccine: Done 08/28/12 repeat every 10 years  ?Shingles vaccine: 1st dose 02/22/22    ?Covid-19:Completed 3/6, 4/16, 10/17/20 & 08/29/21 ? ?Advanced directives: Advance directive discussed with you today. Even though you declined this today please call our office should you change your mind and we can give you the proper paperwork for you to fill out. ? ?Conditions/risks identified: keeping weight down  ? ?Next appointment: Follow up in one year for your annual wellness visit  ? ? ?Preventive Care 70 Years and Older, Female ?Preventive care refers to lifestyle choices and visits with your health care provider that can promote health and wellness. ?What does preventive care include? ?A yearly physical exam. This is also called an annual well check. ?Dental exams once or twice a year. ?Routine eye exams. Ask your health care provider how often you should have your eyes checked. ?Personal lifestyle choices, including: ?Daily care of your teeth and gums. ?Regular physical activity. ?Eating a healthy diet. ?Avoiding tobacco and drug use. ?Limiting alcohol use. ?Practicing safe sex. ?Taking low-dose aspirin every day. ?Taking vitamin and mineral supplements as recommended by your health care provider. ?What happens during an annual well check? ?The services and  screenings done by your health care provider during your annual well check will depend on your age, overall health, lifestyle risk factors, and family history of disease. ?Counseling  ?Your health care provider may ask you questions about your: ?Alcohol use. ?Tobacco use. ?Drug use. ?Emotional well-being. ?Home and relationship well-being. ?Sexual activity. ?Eating habits. ?History of falls. ?Memory and ability to understand (cognition). ?Work and work Astronomer. ?Reproductive health. ?Screening  ?You may have the following tests or measurements: ?Height, weight, and BMI. ?Blood pressure. ?Lipid and cholesterol levels. These may be checked every 5 years, or more frequently if you are over 55 years old. ?Skin check. ?Lung cancer screening. You may have this screening every year starting at age 10 if you have a 30-pack-year history of smoking and currently smoke or have quit within the past 15 years. ?Fecal occult blood test (FOBT) of the stool. You may have this test every year starting at age 61. ?Flexible sigmoidoscopy or colonoscopy. You may have a sigmoidoscopy every 5 years or a colonoscopy every 10 years starting at age 3. ?Hepatitis C blood test. ?Hepatitis B blood test. ?Sexually transmitted disease (STD) testing. ?Diabetes screening. This is done by checking your blood sugar (glucose) after you have not eaten for a while (fasting). You may have this done every 1-3 years. ?Bone density scan. This is done to screen for osteoporosis. You may have this done starting at age 42. ?Mammogram. This may be done every 1-2 years. Talk to your health care provider about how often you should have regular mammograms. ?Talk with your health care provider about your test results, treatment options, and if necessary, the need for more tests. ?Vaccines  ?Your health care provider  may recommend certain vaccines, such as: ?Influenza vaccine. This is recommended every year. ?Tetanus, diphtheria, and acellular pertussis (Tdap,  Td) vaccine. You may need a Td booster every 10 years. ?Zoster vaccine. You may need this after age 37. ?Pneumococcal 13-valent conjugate (PCV13) vaccine. One dose is recommended after age 47. ?Pneumococcal polysaccharide (PPSV23) vaccine. One dose is recommended after age 25. ?Talk to your health care provider about which screenings and vaccines you need and how often you need them. ?This information is not intended to replace advice given to you by your health care provider. Make sure you discuss any questions you have with your health care provider. ?Document Released: 11/10/2015 Document Revised: 07/03/2016 Document Reviewed: 08/15/2015 ?Elsevier Interactive Patient Education ? 2017 [place]. ? ?Fall Prevention in the Home ?Falls can cause injuries. They can happen to people of all ages. There are many things you can do to make your home safe and to help prevent falls. ?What can I do on the outside of my home? ?Regularly fix the edges of walkways and driveways and fix any cracks. ?Remove anything that might make you trip as you walk through a door, such as a raised step or threshold. ?Trim any bushes or trees on the path to your home. ?Use bright outdoor lighting. ?Clear any walking paths of anything that might make someone trip, such as rocks or tools. ?Regularly check to see if handrails are loose or broken. Make sure that both sides of any steps have handrails. ?Any raised decks and porches should have guardrails on the edges. ?Have any leaves, snow, or ice cleared regularly. ?Use sand or salt on walking paths during winter. ?Clean up any spills in your garage right away. This includes oil or grease spills. ?What can I do in the bathroom? ?Use night lights. ?Install grab bars by the toilet and in the tub and shower. Do not use towel bars as grab bars. ?Use non-skid mats or decals in the tub or shower. ?If you need to sit down in the shower, use a plastic, non-slip stool. ?Keep the floor dry. Clean up any  water that spills on the floor as soon as it happens. ?Remove soap buildup in the tub or shower regularly. ?Attach bath mats securely with double-sided non-slip rug tape. ?Do not have throw rugs and other things on the floor that can make you trip. ?What can I do in the bedroom? ?Use night lights. ?Make sure that you have a light by your bed that is easy to reach. ?Do not use any sheets or blankets that are too big for your bed. They should not hang down onto the floor. ?Have a firm chair that has side arms. You can use this for support while you get dressed. ?Do not have throw rugs and other things on the floor that can make you trip. ?What can I do in the kitchen? ?Clean up any spills right away. ?Avoid walking on wet floors. ?Keep items that you use a lot in easy-to-reach places. ?If you need to reach something above you, use a strong step stool that has a grab bar. ?Keep electrical cords out of the way. ?Do not use floor polish or wax that makes floors slippery. If you must use wax, use non-skid floor wax. ?Do not have throw rugs and other things on the floor that can make you trip. ?What can I do with my stairs? ?Do not leave any items on the stairs. ?Make sure that there are handrails on both sides  of the stairs and use them. Fix handrails that are broken or loose. Make sure that handrails are as long as the stairways. ?Check any carpeting to make sure that it is firmly attached to the stairs. Fix any carpet that is loose or worn. ?Avoid having throw rugs at the top or bottom of the stairs. If you do have throw rugs, attach them to the floor with carpet tape. ?Make sure that you have a light switch at the top of the stairs and the bottom of the stairs. If you do not have them, ask someone to add them for you. ?What else can I do to help prevent falls? ?Wear shoes that: ?Do not have high heels. ?Have rubber bottoms. ?Are comfortable and fit you well. ?Are closed at the toe. Do not wear sandals. ?If you use a  stepladder: ?Make sure that it is fully opened. Do not climb a closed stepladder. ?Make sure that both sides of the stepladder are locked into place. ?Ask someone to hold it for you, if possible. ?[place]

## 2022-02-28 NOTE — Progress Notes (Signed)
Virtual Visit via Telephone Note ? ?I connected with  Meghan RutterJanice Marie Cox on 02/28/22 at  9:45 AM EDT by telephone and verified that I am speaking with the correct person using two identifiers. ? ?Medicare Annual Wellness visit completed telephonically due to Covid-19 pandemic.  ? ?Persons participating in this call: This Health Coach and this patient.  ? ?Location: ?Patient: Home ?Provider: Office  ?  ?I discussed the limitations, risks, security and privacy concerns of performing an evaluation and management service by telephone and the availability of in person appointments. The patient expressed understanding and agreed to proceed. ? ?Unable to perform video visit due to video visit attempted and failed and/or patient does not have video capability.  ? ?Some vital signs may be absent or patient reported.  ? ?Meghan Cox H Meghan Simien, LPN ? ?Subjective:  ? Meghan Cox is a 71 y.o. female who presents for an Initial Medicare Annual Wellness Visit. ? ?Review of Systems    ? ?  ? ?   ?Objective:  ?  ?There were no vitals filed for this visit. ?There is no height or weight on file to calculate BMI. ? ? ?  02/28/2022  ?  9:50 AM 11/26/2021  ?  9:33 AM  ?Advanced Directives  ?Does Patient Have a Medical Advance Directive? No No  ?Would patient like information on creating a medical advance directive? No - Patient declined   ? ? ?Current Medications (verified) ?Outpatient Encounter Medications as of 02/28/2022  ?Medication Sig  ? atorvastatin (LIPITOR) 20 MG tablet TAKE 1 TABLET BY MOUTH EVERY DAY  ? loratadine (CLARITIN) 10 MG tablet Take 10 mg by mouth daily.  ? ?No facility-administered encounter medications on file as of 02/28/2022.  ? ? ?Allergies (verified) ?Other  ? ?History: ?Past Medical History:  ?Diagnosis Date  ? Heart murmur   ? Seasonal allergies   ? ?Past Surgical History:  ?Procedure Laterality Date  ? HAMMER TOE SURGERY    ? ?Family History  ?Problem Relation Age of Onset  ? Heart disease Mother   ?  Hyperlipidemia Mother   ? Cancer Father   ?     Prostate  ? Hypertension Father   ? Hypertension Maternal Grandmother   ? Hypertension Maternal Grandfather   ? Cancer Paternal Grandmother   ?     Breast  ? Hypertension Paternal Grandfather   ? ?Social History  ? ?Socioeconomic History  ? Marital status: Married  ?  Spouse name: Not on file  ? Number of children: Not on file  ? Years of education: Not on file  ? Highest education level: Not on file  ?Occupational History  ? Not on file  ?Tobacco Use  ? Smoking status: Every Day  ?  Packs/day: 0.25  ?  Types: Cigarettes  ? Smokeless tobacco: Never  ?Vaping Use  ? Vaping Use: Never used  ?Substance and Sexual Activity  ? Alcohol use: No  ? Drug use: No  ? Sexual activity: Yes  ?Other Topics Concern  ? Not on file  ?Social History Narrative  ? Not on file  ? ?Social Determinants of Health  ? ?Financial Resource Strain: Low Risk   ? Difficulty of Paying Living Expenses: Not hard at all  ?Food Insecurity: No Food Insecurity  ? Worried About Programme researcher, broadcasting/film/videounning Out of Food in the Last Year: Never true  ? Ran Out of Food in the Last Year: Never true  ?Transportation Needs: No Transportation Needs  ? Lack of Transportation (Medical): No  ?  Lack of Transportation (Non-Medical): No  ?Physical Activity: Inactive  ? Days of Exercise per Week: 0 days  ? Minutes of Exercise per Session: 0 min  ?Stress: No Stress Concern Present  ? Feeling of Stress : Not at all  ?Social Connections: Moderately Integrated  ? Frequency of Communication with Friends and Family: More than three times a week  ? Frequency of Social Gatherings with Friends and Family: More than three times a week  ? Attends Religious Services: 1 to 4 times per year  ? Active Member of Clubs or Organizations: No  ? Attends Banker Meetings: Never  ? Marital Status: Married  ? ? ?Tobacco Counseling ?Ready to quit: Not Answered ?Counseling given: Not Answered ? ? ?Clinical Intake: ? ?Pre-visit preparation completed:  Yes ? ?Pain : No/denies pain ? ?  ? ?BMI - recorded: 30.29 ?Nutritional Status: BMI > 30  Obese ?Nutritional Risks: None ?Diabetes: No ? ?How often do you need to have someone help you when you read instructions, pamphlets, or other written materials from your doctor or pharmacy?: 1 - Never ? ?Diabetic?no ? ?Interpreter Needed?: No ? ?Information entered by :: Lanier Ensign, LPN ? ? ?Activities of Daily Living ? ?  11/26/2021  ?  9:32 AM  ?In your present state of health, do you have any difficulty performing the following activities:  ?Hearing? 0  ?Vision? 0  ?Difficulty concentrating or making decisions? 0  ?Walking or climbing stairs? 0  ?Dressing or bathing? 0  ?Doing errands, shopping? 0  ? ? ?Patient Care Team: ?Willow Ora, MD as PCP - General (Family Medicine) ? ?Indicate any recent Medical Services you may have received from other than [facility] providers in the past year (date may be approximate). ? ?   ?Assessment:  ? This is a routine wellness examination for Meghan Cox. ? ?Hearing/Vision screen ?Hearing Screening - Comments:: Pt denies any hearing issues  ?Vision Screening - Comments:: Pt follows up with Dr Randon Goldsmith for annual eye exams  ? ?Dietary issues and exercise activities discussed: ?  ? ? Goals Addressed   ? ?  ?  ?  ?  ? This Visit's Progress  ?  Patient Stated     ?  Keeping weight down  ?  ? ?  ? ?Depression Screen ? ?  02/28/2022  ?  9:49 AM 11/26/2021  ?  9:35 AM 08/25/2020  ? 11:19 AM 07/21/2019  ? 11:26 AM 04/23/2018  ?  6:36 PM 02/21/2017  ?  7:54 AM  ?PHQ 2/9 Scores  ?PHQ - 2 Score 0 0 0 0 0 0  ?PHQ- 9 Score  0      ?  ?Fall Risk ? ?  11/26/2021  ?  9:35 AM 11/26/2021  ?  9:32 AM 04/23/2018  ?  6:36 PM 04/17/2018  ? 10:44 AM 02/21/2017  ?  7:54 AM  ?Fall Risk   ?Falls in the past year?  0 No No No  ?Number falls in past yr:  0     ?Injury with Fall?  0     ?Risk for fall due to : No Fall Risks No Fall Risks     ? ? ?FALL RISK PREVENTION PERTAINING TO THE HOME: ? ?Any stairs in or around the home? Yes  ?If  so, are there any without handrails? No  ?Home free of loose throw rugs in walkways, pet beds, electrical cords, etc? Yes  ?Adequate lighting in your home to reduce risk of falls?  Yes  ? ?ASSISTIVE DEVICES UTILIZED TO PREVENT FALLS: ? ?Life alert? No  ?Use of a cane, walker or w/c? No  ?Grab bars in the bathroom? No  ?Shower chair or bench in shower? No  ?Elevated toilet seat or a handicapped toilet? No  ? ?TIMED UP AND GO: ? ?Was the test performed? No .  ? ?Cognitive Function: ?  ?  ?  ? ?Immunizations ?Immunization History  ?Administered Date(s) Administered  ? Fluad Quad(high Dose 65+) 07/21/2019, 08/25/2020  ? Influenza Whole 09/12/2018  ? Influenza, High Dose Seasonal PF 08/29/2021  ? PFIZER(Purple Top)SARS-COV-2 Vaccination 01/11/2020, 02/01/2020, 09/28/2020  ? Research officer, trade union 65yrs & up 08/29/2021  ? Pneumococcal Conjugate-13 08/25/2020  ? Pneumococcal Polysaccharide-23 08/28/2012, 04/11/2017  ? Tdap 08/28/2012  ? Zoster Recombinat (Shingrix) 02/22/2022  ? Zoster, Live 11/30/2012  ? ? ?TDAP status: Up to date ? ?Flu Vaccine status: Up to date ? ?Pneumococcal vaccine status: Up to date ? ?Covid-19 vaccine status: Completed vaccines ? ?Qualifies for Shingles Vaccine? Yes   ?Zostavax completed Yes   ?Shingrix Completed?: No.    Education has been provided regarding the importance of this vaccine. Patient has been advised to call insurance company to determine out of pocket expense if they have not yet received this vaccine. Advised may also receive vaccine at local pharmacy or Health Dept. Verbalized acceptance and understanding. ? ?Screening Tests ?Health Maintenance  ?Topic Date Due  ? DEXA SCAN  12/05/2016  ? Zoster Vaccines- Shingrix (2 of 2) 04/19/2022  ? INFLUENZA VACCINE  05/28/2022  ? TETANUS/TDAP  08/28/2022  ? MAMMOGRAM  10/30/2022  ? COLONOSCOPY (Pts 45-10yrs Insurance coverage will need to be confirmed)  01/07/2026  ? Pneumonia Vaccine 52+ Years old  Completed  ? COVID-19  Vaccine  Completed  ? Hepatitis C Screening  Completed  ? HPV VACCINES  Aged Out  ? ? ?Health Maintenance ? ?Health Maintenance Due  ?Topic Date Due  ? DEXA SCAN  12/05/2016  ? ? ?Colorectal cancer screening: Type of [place]

## 2022-03-01 ENCOUNTER — Ambulatory Visit: Payer: Medicare Other

## 2022-05-16 DIAGNOSIS — M1811 Unilateral primary osteoarthritis of first carpometacarpal joint, right hand: Secondary | ICD-10-CM | POA: Diagnosis not present

## 2022-05-17 ENCOUNTER — Ambulatory Visit
Admission: RE | Admit: 2022-05-17 | Discharge: 2022-05-17 | Disposition: A | Discharge status: Home / Self Care | Payer: Medicare Other | Source: Ambulatory Visit | Attending: Family Medicine | Admitting: Family Medicine

## 2022-05-17 ENCOUNTER — Encounter: Payer: Self-pay | Admitting: Family Medicine

## 2022-05-17 DIAGNOSIS — Z780 Asymptomatic menopausal state: Secondary | ICD-10-CM

## 2022-05-17 DIAGNOSIS — M810 Age-related osteoporosis without current pathological fracture: Secondary | ICD-10-CM

## 2022-05-17 DIAGNOSIS — Z0000 Encounter for general adult medical examination without abnormal findings: Secondary | ICD-10-CM

## 2022-05-17 HISTORY — DX: Age-related osteoporosis without current pathological fracture: M81.0

## 2022-05-17 NOTE — Progress Notes (Signed)
Please call patient: I have reviewed his/her lab results. Bone density results show she has osteoporosis. I recommend an OV to explain and discuss treatment options. She can schedule at her convenience.  Thanks, Dr. Mardelle Matte

## 2022-05-29 ENCOUNTER — Ambulatory Visit (INDEPENDENT_AMBULATORY_CARE_PROVIDER_SITE_OTHER): Payer: Medicare Other | Admitting: Family Medicine

## 2022-05-29 ENCOUNTER — Encounter: Payer: Self-pay | Admitting: Family Medicine

## 2022-05-29 VITALS — BP 138/74 | HR 85 | Temp 97.8°F | Ht 61.0 in | Wt 160.2 lb

## 2022-05-29 DIAGNOSIS — F17210 Nicotine dependence, cigarettes, uncomplicated: Secondary | ICD-10-CM

## 2022-05-29 DIAGNOSIS — Z716 Tobacco abuse counseling: Secondary | ICD-10-CM | POA: Diagnosis not present

## 2022-05-29 DIAGNOSIS — M810 Age-related osteoporosis without current pathological fracture: Secondary | ICD-10-CM

## 2022-05-29 DIAGNOSIS — Z122 Encounter for screening for malignant neoplasm of respiratory organs: Secondary | ICD-10-CM

## 2022-05-29 MED ORDER — SHINGRIX 50 MCG/0.5ML IM SUSR: 0.5000 mL | Freq: Once | INTRAMUSCULAR | 0 refills | Status: AC

## 2022-05-29 MED ORDER — ALENDRONATE SODIUM 70 MG PO TABS: 70.0000 mg | ORAL_TABLET | ORAL | 3 refills | Status: DC

## 2022-05-29 NOTE — Progress Notes (Signed)
Subjective  CC:  Chief Complaint  Patient presents with   Osteoporosis    Pt here to F/U on Bone Density report    HPI: Meghan Cox is a 71 y.o. female who presents to the office today to address the problems listed above in the chief complaint. Osteoporosis: Reviewed bone density.  Lowest T score was -2.8 at the wrist.  Osteopenia at the hip and spine.  Has not been on calcium and vitamin D but she did purchase some supplements to start.  No history of fractures.  She is a long-term smoker which is likely contributed.  She would be open to learning about treatment options.  She is active.  Works in yard and things around home. Cigarette nicotine dependence: 30 to 40 pack year history.  Has yet to schedule lung cancer screening test but has phone number.  She remains interested.  Asymptomatic.  Has continued to cut down her smoking.  Now will go several days without a cigarette.  Smoking about half pack per week.  Typically only used for stress management.  Assessment  1. Osteoporosis, postmenopausal   2. Cigarette nicotine dependence without complication   3. Encounter for screening for lung cancer   4. Encounter for smoking cessation counseling      Plan  Osteoporosis postmenopausal, new diagnosis: Discussed ways to prevent worsening.Recommend weightbearing exercise, smoking cessation, calcium and vitamin D supplementation, and initiation of Fosamax weekly.  Discussed risk and benefits of medications.  Discussed possible side effects.  Discussed appropriate dosing.  Patient will start after her upcoming thumb surgery.  We will recheck bone density in 2 years. Cigarette dependence and smoking cessation: Had long discussion.  Patient is now ready for complete cessation.  Recommend using quit date, cleaning out the house, and we problem solved how to manage stress without cigarettes.  She has multiple outlets.  She should do well. Eligible for lung cancer screening with low-dose chest  CT.  Patient will call to schedule. Health maintenance: Eligible for second dose of Shingrix.  She did receive her first at pharmacy.  Follow up: January for complete physical and follow-up Visit date not found  No orders of the defined types were placed in this encounter.  Meds ordered this encounter  Medications   alendronate (FOSAMAX) 70 MG tablet    Sig: Take 1 tablet (70 mg total) by mouth once a week. Take with a full glass of water on an empty stomach.    Dispense:  12 tablet    Refill:  3   Zoster Vaccine Adjuvanted [facility]) injection    Sig: Inject 0.5 mLs into the muscle once for 1 dose.    Dispense:  1 each    Refill:  0      I reviewed the patients updated PMH, FH, and SocHx.    Patient Active Problem List   Diagnosis Date Noted   Osteoporosis, postmenopausal 05/17/2022   Chronic allergic rhinitis 08/25/2020   Osteoarthritis of spine with radiculopathy, lumbosacral region 06/23/2018   White coat syndrome without diagnosis of hypertension 07/05/2017   Obesity (BMI 30-39.9) 07/05/2017   Cigarette nicotine dependence without complication 07/05/2017   Current Meds  Medication Sig   alendronate (FOSAMAX) 70 MG tablet Take 1 tablet (70 mg total) by mouth once a week. Take with a full glass of water on an empty stomach.   atorvastatin (LIPITOR) 20 MG tablet TAKE 1 TABLET BY MOUTH EVERY DAY   loratadine (CLARITIN) 10 MG tablet Take 10  mg by mouth daily.   Zoster Vaccine Adjuvanted [facility]) injection Inject 0.5 mLs into the muscle once for 1 dose.    Allergies: Patient is allergic to other. Family History: Patient family history includes Cancer in her father and paternal grandmother; Heart disease in her mother; Hyperlipidemia in her mother; Hypertension in her father, maternal grandfather, maternal grandmother, and paternal grandfather. Social History:  Patient  reports that she has been smoking cigarettes. She has been smoking an average of .25 packs per day. She  has never used smokeless tobacco. She reports that she does not drink alcohol and does not use drugs.  Review of Systems: Constitutional: Negative for fever malaise or anorexia Cardiovascular: negative for chest pain Respiratory: negative for SOB or persistent cough Gastrointestinal: negative for abdominal pain  Objective  Vitals: BP 138/74   Pulse 85   Temp 97.8 F (36.6 C)   Ht 5\' 1"  (1.549 m)   Wt 160 lb 3.2 oz (72.7 kg)   SpO2 98%   BMI 30.27 kg/m  General: no acute distress , A&Ox3  Commons side effects, risks, benefits, and alternatives for medications and treatment plan prescribed today were discussed, and the patient expressed understanding of the given instructions. Patient is instructed to call or message via MyChart if he/she has any questions or concerns regarding our treatment plan. No barriers to understanding were identified. We discussed Red Flag symptoms and signs in detail. Patient expressed understanding regarding what to do in case of urgent or emergency type symptoms.  Medication list was reconciled, printed and provided to the patient in AVS. Patient instructions and summary information was reviewed with the patient as documented in the AVS. This note was prepared with assistance of Dragon voice recognition software. Occasional wrong-word or sound-a-like substitutions may have occurred due to the inherent limitations of voice recognition software  This visit occurred during the SARS-CoV-2 public health emergency.  Safety protocols were in place, including screening questions prior to the visit, additional usage of staff PPE, and extensive cleaning of exam room while observing appropriate contact time as indicated for disinfecting solutions.

## 2022-05-29 NOTE — Patient Instructions (Addendum)
Please return in January 2024 for your annual complete physical; please come fasting.   If you have any questions or concerns, please don't hesitate to send me a message via MyChart or call the office at [phone]. Thank you for visiting with [place] today! It's our pleasure caring for you.   Osteoporosis  Osteoporosis happens when the bones become thin and less dense than normal. Osteoporosis makes bones more brittle and fragile and more likely to break (fracture). Over time, osteoporosis can cause your bones to become so weak that they fracture after a minor fall. Bones in the hip, wrist, and spine are most likely to fracture due to osteoporosis. What are the causes? The exact cause of this condition is not known. What increases the risk? You are more likely to develop this condition if you: Have family members with this condition. Have poor nutrition. Use the following: Steroid medicines, such as prednisone. Anti-seizure medicines. Nicotine or tobacco, such as cigarettes, e-cigarettes, and chewing tobacco. Are female. Are age 32 or older. Are not physically active (are sedentary). Are of European or Asian descent. Have a small body frame. What are the signs or symptoms? A fracture might be the first sign of osteoporosis, especially if the fracture results from a fall or injury that usually would not cause a bone to break. Other signs and symptoms include: Pain in the neck or low back. Stooped posture. Loss of height. How is this diagnosed? This condition may be diagnosed based on: Your medical history. A physical exam. A bone mineral density test, also called a DXA or DEXA test (dual-energy X-ray absorptiometry test). This test uses X-rays to measure the amount of minerals in your bones. How is this treated? This condition may be treated by: Making lifestyle changes, such as: Including foods with more calcium and vitamin D in your diet. Doing weight-bearing and  muscle-strengthening exercises. Stopping tobacco use. Limiting alcohol intake. Taking medicine to slow the process of bone loss or to increase bone density. Taking daily supplements of calcium and vitamin D. Taking hormone replacement medicines, such as estrogen for women and testosterone for men. Monitoring your levels of calcium and vitamin D. The goal of treatment is to strengthen your bones and lower your risk for a fracture. Follow these instructions at home: Eating and drinking Include calcium and vitamin D in your diet. Calcium is important for bone health, and vitamin D helps your body absorb calcium. Good sources of calcium and vitamin D include: Certain fatty fish, such as salmon and tuna. Products that have calcium and vitamin D added to them (are fortified), such as fortified cereals. Egg yolks. Cheese. Liver.  Activity Do exercises as told by your health care provider. Ask your health care provider what exercises and activities are safe for you. You should do: Exercises that make you work against gravity (weight-bearing exercises), such as tai chi, yoga, or walking. Exercises to strengthen muscles, such as lifting weights. Lifestyle Do not drink alcohol if: Your health care provider tells you not to drink. You are pregnant, may be pregnant, or are planning to become pregnant. If you drink alcohol: Limit how much you use to: 0-1 drink a day for women. 0-2 drinks a day for men. Know how much alcohol is in your drink. In the [place] one drink equals one 12 oz bottle of beer (355 mL), one 5 oz glass of wine (148 mL), or one 1 oz glass of hard liquor (44 mL). Do not use any products that contain  nicotine or tobacco, such as cigarettes, e-cigarettes, and chewing tobacco. If you need help quitting, ask your health care provider. Preventing falls Use devices to help you move around (mobility aids) as needed, such as canes, walkers, scooters, or crutches. Keep rooms well-lit and  clutter-free. Remove tripping hazards from walkways, including cords and throw rugs. Install grab bars in bathrooms and safety rails on stairs. Use rubber mats in the bathroom and other areas that are often wet or slippery. Wear closed-toe shoes that fit well and support your feet. Wear shoes that have rubber soles or low heels. Review your medicines with your health care provider. Some medicines can cause dizziness or changes in blood pressure, which can increase your risk of falling. General instructions Take over-the-counter and prescription medicines only as told by your health care provider. Keep all follow-up visits. This is important. Contact a health care provider if: You have never been screened for osteoporosis and you are: A woman who is age 9 or older. A man who is age 16 or older. Get help right away if: You fall or injure yourself. Summary Osteoporosis is thinning and loss of density in your bones. This makes bones more brittle and fragile and more likely to break (fracture),even with minor falls. The goal of treatment is to strengthen your bones and lower your risk for a fracture. Include calcium and vitamin D in your diet. Calcium is important for bone health, and vitamin D helps your body absorb calcium. Talk with your health care provider about screening for osteoporosis if you are a woman who is age 52 or older, or a man who is age 58 or older. This information is not intended to replace advice given to you by your health care provider. Make sure you discuss any questions you have with your health care provider. Document Revised: 03/30/2020 Document Reviewed: 03/30/2020 Elsevier Patient Education  [place].

## 2022-05-31 DIAGNOSIS — G8918 Other acute postprocedural pain: Secondary | ICD-10-CM | POA: Diagnosis not present

## 2022-05-31 DIAGNOSIS — M1811 Unilateral primary osteoarthritis of first carpometacarpal joint, right hand: Secondary | ICD-10-CM | POA: Diagnosis not present

## 2022-05-31 DIAGNOSIS — M19031 Primary osteoarthritis, right wrist: Secondary | ICD-10-CM | POA: Diagnosis not present

## 2022-05-31 HISTORY — PX: THUMB ARTHROSCOPY: SHX2509

## 2022-06-05 DIAGNOSIS — H2513 Age-related nuclear cataract, bilateral: Secondary | ICD-10-CM | POA: Diagnosis not present

## 2022-06-05 DIAGNOSIS — H524 Presbyopia: Secondary | ICD-10-CM | POA: Diagnosis not present

## 2022-06-05 DIAGNOSIS — H5203 Hypermetropia, bilateral: Secondary | ICD-10-CM | POA: Diagnosis not present

## 2022-06-14 DIAGNOSIS — M25631 Stiffness of right wrist, not elsewhere classified: Secondary | ICD-10-CM | POA: Diagnosis not present

## 2022-06-14 DIAGNOSIS — M1811 Unilateral primary osteoarthritis of first carpometacarpal joint, right hand: Secondary | ICD-10-CM | POA: Diagnosis not present

## 2022-06-14 DIAGNOSIS — Z4889 Encounter for other specified surgical aftercare: Secondary | ICD-10-CM | POA: Diagnosis not present

## 2022-06-14 DIAGNOSIS — M25641 Stiffness of right hand, not elsewhere classified: Secondary | ICD-10-CM | POA: Diagnosis not present

## 2022-06-14 DIAGNOSIS — M25541 Pain in joints of right hand: Secondary | ICD-10-CM | POA: Diagnosis not present

## 2022-06-19 DIAGNOSIS — M25641 Stiffness of right hand, not elsewhere classified: Secondary | ICD-10-CM | POA: Diagnosis not present

## 2022-06-19 DIAGNOSIS — M25541 Pain in joints of right hand: Secondary | ICD-10-CM | POA: Diagnosis not present

## 2022-06-19 DIAGNOSIS — M25631 Stiffness of right wrist, not elsewhere classified: Secondary | ICD-10-CM | POA: Diagnosis not present

## 2022-06-19 DIAGNOSIS — M1811 Unilateral primary osteoarthritis of first carpometacarpal joint, right hand: Secondary | ICD-10-CM | POA: Diagnosis not present

## 2022-06-19 DIAGNOSIS — Z4889 Encounter for other specified surgical aftercare: Secondary | ICD-10-CM | POA: Diagnosis not present

## 2022-07-12 DIAGNOSIS — M25641 Stiffness of right hand, not elsewhere classified: Secondary | ICD-10-CM | POA: Diagnosis not present

## 2022-07-12 DIAGNOSIS — Z4889 Encounter for other specified surgical aftercare: Secondary | ICD-10-CM | POA: Diagnosis not present

## 2022-07-12 DIAGNOSIS — M25541 Pain in joints of right hand: Secondary | ICD-10-CM | POA: Diagnosis not present

## 2022-07-12 DIAGNOSIS — M25631 Stiffness of right wrist, not elsewhere classified: Secondary | ICD-10-CM | POA: Diagnosis not present

## 2022-07-12 DIAGNOSIS — M1811 Unilateral primary osteoarthritis of first carpometacarpal joint, right hand: Secondary | ICD-10-CM | POA: Diagnosis not present

## 2022-07-22 ENCOUNTER — Encounter: Payer: Self-pay | Admitting: *Deleted

## 2022-07-26 DIAGNOSIS — M1811 Unilateral primary osteoarthritis of first carpometacarpal joint, right hand: Secondary | ICD-10-CM | POA: Diagnosis not present

## 2022-07-26 DIAGNOSIS — M25631 Stiffness of right wrist, not elsewhere classified: Secondary | ICD-10-CM | POA: Diagnosis not present

## 2022-07-26 DIAGNOSIS — Z4889 Encounter for other specified surgical aftercare: Secondary | ICD-10-CM | POA: Diagnosis not present

## 2022-07-26 DIAGNOSIS — M25541 Pain in joints of right hand: Secondary | ICD-10-CM | POA: Diagnosis not present

## 2022-07-26 DIAGNOSIS — M25641 Stiffness of right hand, not elsewhere classified: Secondary | ICD-10-CM | POA: Diagnosis not present

## 2022-08-09 DIAGNOSIS — M1811 Unilateral primary osteoarthritis of first carpometacarpal joint, right hand: Secondary | ICD-10-CM | POA: Diagnosis not present

## 2022-08-09 DIAGNOSIS — M25541 Pain in joints of right hand: Secondary | ICD-10-CM | POA: Diagnosis not present

## 2022-08-09 DIAGNOSIS — M25641 Stiffness of right hand, not elsewhere classified: Secondary | ICD-10-CM | POA: Diagnosis not present

## 2022-08-09 DIAGNOSIS — M25631 Stiffness of right wrist, not elsewhere classified: Secondary | ICD-10-CM | POA: Diagnosis not present

## 2022-08-09 DIAGNOSIS — Z4889 Encounter for other specified surgical aftercare: Secondary | ICD-10-CM | POA: Diagnosis not present

## 2022-08-14 ENCOUNTER — Encounter: Payer: Self-pay | Admitting: *Deleted

## 2022-08-14 ENCOUNTER — Telehealth: Payer: Self-pay | Admitting: *Deleted

## 2022-08-14 NOTE — Patient Outreach (Signed)
  Care Coordination   Initial Visit Note   08/14/2022 Name: Meghan Cox MRN: [id number] DOB: 1951/07/17  Meghan Cox is a 71 y.o. year old female who sees Leamon Arnt, MD for primary care. I spoke with  Meghan Cox by phone today.  What matters to the patients health and wellness today?  No needs    Goals Addressed               This Visit's Progress     COMPLETED: No needs (pt-stated)        Care Coordination Interventions: Reviewed medications with patient and discussed Adherence with no needed refills Reviewed scheduled/upcoming provider appointments including pending appointments and verified completed AWV for this year already Screening for signs and symptoms of depression related to chronic disease state  Assessed social determinant of health barriers         SDOH assessments and interventions completed:  Yes  SDOH Interventions Today    Flowsheet Row Most Recent Value  SDOH Interventions   Food Insecurity Interventions Intervention Not Indicated  Housing Interventions Intervention Not Indicated  Transportation Interventions Intervention Not Indicated  Utilities Interventions Intervention Not Indicated        Care Coordination Interventions Activated:  Yes  Care Coordination Interventions:  Yes, provided   Follow up plan: No further intervention required.   Encounter Outcome:  Pt. Visit Completed   Raina Mina, RN Care Management Coordinator [place] Office [phone]

## 2022-08-14 NOTE — Patient Instructions (Signed)
Visit Information  Thank you for taking time to visit with me today. Please don't hesitate to contact me if I can be of assistance to you.   Following are the goals we discussed today:   Goals Addressed               This Visit's Progress     COMPLETED: No needs (pt-stated)        Care Coordination Interventions: Reviewed medications with patient and discussed Adherence with no needed refills Reviewed scheduled/upcoming provider appointments including pending appointments and verified completed AWV for this year already Screening for signs and symptoms of depression related to chronic disease state  Assessed social determinant of health barriers         Please call the care guide team at [phone] if you need to cancel or reschedule your appointment.   If you are experiencing a Mental Health or [place] or need someone to talk to, please call the Suicide and Crisis Lifeline: 988  Patient verbalizes understanding of instructions and care plan provided today and agrees to view in [place]. Active MyChart status and patient understanding of how to access instructions and care plan via MyChart confirmed with patient.     No further follow up required: No needs  Raina Mina, RN Care Management Coordinator [place] Office [phone]

## 2022-08-29 DIAGNOSIS — M1811 Unilateral primary osteoarthritis of first carpometacarpal joint, right hand: Secondary | ICD-10-CM | POA: Diagnosis not present

## 2022-10-10 ENCOUNTER — Encounter: Payer: Self-pay | Admitting: *Deleted

## 2022-10-27 ENCOUNTER — Other Ambulatory Visit: Payer: Self-pay | Admitting: Family Medicine

## 2022-11-27 ENCOUNTER — Encounter: Payer: Medicare Other | Admitting: Family Medicine

## 2022-12-30 ENCOUNTER — Other Ambulatory Visit: Payer: Self-pay | Admitting: Family Medicine

## 2022-12-30 DIAGNOSIS — Z1231 Encounter for screening mammogram for malignant neoplasm of breast: Secondary | ICD-10-CM

## 2023-01-01 ENCOUNTER — Telehealth: Payer: Self-pay

## 2023-01-01 ENCOUNTER — Ambulatory Visit (INDEPENDENT_AMBULATORY_CARE_PROVIDER_SITE_OTHER): Payer: Medicare Other | Admitting: Family Medicine

## 2023-01-01 ENCOUNTER — Encounter: Payer: Self-pay | Admitting: Family Medicine

## 2023-01-01 VITALS — BP 136/80 | HR 83 | Temp 97.9°F | Ht 61.0 in | Wt 154.8 lb

## 2023-01-01 DIAGNOSIS — M810 Age-related osteoporosis without current pathological fracture: Secondary | ICD-10-CM | POA: Diagnosis not present

## 2023-01-01 DIAGNOSIS — R030 Elevated blood-pressure reading, without diagnosis of hypertension: Secondary | ICD-10-CM | POA: Diagnosis not present

## 2023-01-01 DIAGNOSIS — Z0000 Encounter for general adult medical examination without abnormal findings: Secondary | ICD-10-CM | POA: Diagnosis not present

## 2023-01-01 DIAGNOSIS — E782 Mixed hyperlipidemia: Secondary | ICD-10-CM | POA: Diagnosis not present

## 2023-01-01 DIAGNOSIS — Z122 Encounter for screening for malignant neoplasm of respiratory organs: Secondary | ICD-10-CM

## 2023-01-01 DIAGNOSIS — F17210 Nicotine dependence, cigarettes, uncomplicated: Secondary | ICD-10-CM

## 2023-01-01 MED ORDER — SHINGRIX 50 MCG/0.5ML IM SUSR: 0.5000 mL | Freq: Once | INTRAMUSCULAR | 0 refills | Status: AC

## 2023-01-01 NOTE — Telephone Encounter (Signed)
Pt needs to get set up with Prolia.  What needs to be done.  Thank you,  Leamon Arnt

## 2023-01-01 NOTE — Patient Instructions (Addendum)
Please return in 12 months for your annual complete physical; please come fasting.   I will release your lab results to you on your MyChart account with further instructions. You may see the results before I do, but when I review them I will send you a message with my report or have my assistant call you if things need to be discussed. Please reply to my message with any questions. Thank you!   If you have any questions or concerns, please don't hesitate to send me a message via MyChart or call the office at [phone]. Thank you for visiting with [place] today! It's our pleasure caring for you.   Call to set up the lung cancer screening appointment. They will call you if you don't have the number.   Good job with the smoking! Keep it up.

## 2023-01-01 NOTE — Progress Notes (Signed)
Subjective  Chief Complaint  Patient presents with   Annual Exam    Pt here for Annual exam and is currently fasting     HPI: Meghan Cox is a 72 y.o. female who presents to [facility] [place] today for a Female Wellness Visit. She also has the concerns and/or needs as listed above in the chief complaint. These will be addressed in addition to the Health Maintenance Visit.   Wellness Visit: annual visit with health maintenance review and exam without Pap  HM: lung cancer screening: never called for appt. Crc screen current. Mammo scheduled. Dexa current. Needs 2nd shingrix Chronic disease f/u and/or acute problem visit: (deemed necessary to be done in addition to the wellness visit): Osteoporosis: never started the fosamax due to has dental/gum problems and too risky. Would be interested in prolia. On ca and vit d now HTN< whitecoat: home readings are stable. No cp or sob Chronic smoker: > 50 pak year history. Needs lung cancer screening. Denies sxs of copd.  HLD On lipitor 20. Non fasting for recheck. Has been controlled.   Assessment  1. Annual physical exam   2. White coat syndrome without diagnosis of hypertension   3. Osteoporosis, postmenopausal   4. Mixed hyperlipidemia   5. Cigarette nicotine dependence without complication   6. Encounter for screening for lung cancer      Plan  Female Wellness Visit: Age appropriate Health Maintenance and Prevention measures were discussed with patient. Included topics are cancer screening recommendations, ways to keep healthy (see AVS) including dietary and exercise recommendations, regular eye and dental care, use of seat belts, and avoidance of moderate alcohol use and tobacco use. Lung cancer screen referral again placed. Mammo scheduled.  BMI: discussed patient's BMI and encouraged positive lifestyle modifications to help get to or maintain a target BMI. HM needs and immunizations were addressed and ordered.  See below for orders. See HM and immunization section for updates. Shingrix Rx given Routine labs and screening tests ordered including cmp, cbc and lipids where appropriate. Discussed recommendations regarding Vit D and calcium supplementation (see AVS)  Chronic disease management visit and/or acute problem visit: Osteoporosis: CI to fosamax due to new dental/bone disease. Change to prolia. Education given. Start approval process. Cont vit d and ca White coat htn; stable in office today. Continue home monitoring HLD on lipitor 20 for recheck. Goal ldl < 100 Smoking: almost quit. 2-3/every 2-3 weeks! Continue to recommend complete cessation.   Follow up: 12 mo for cpe  Orders Placed This Encounter  Procedures   CBC with Differential/Platelet   Comprehensive metabolic panel   Lipid panel   Ambulatory Referral for Lung Cancer Scre   Meds ordered this encounter  Medications   Zoster Vaccine Adjuvanted [facility]) injection    Sig: Inject 0.5 mLs into the muscle once for 1 dose. She has had her first dose already, 2022    Dispense:  1 each    Refill:  0      Body mass index is 29.25 kg/m. Wt Readings from Last 3 Encounters:  01/01/23 154 lb 12.8 oz (70.2 kg)  05/29/22 160 lb 3.2 oz (72.7 kg)  11/26/21 160 lb 3.2 oz (72.7 kg)     Patient Active Problem List   Diagnosis Date Noted   Mixed hyperlipidemia 01/01/2023   Osteoporosis, postmenopausal 05/17/2022    DEXA 04/2022: left femur: T=-2.8; rec treatment.     Chronic allergic rhinitis 08/25/2020   Osteoarthritis of spine  with radiculopathy, lumbosacral region 06/23/2018   White coat syndrome without diagnosis of hypertension 07/05/2017    .    Obesity (BMI 30-39.9) 07/05/2017   Cigarette nicotine dependence without complication [id number]   Health Maintenance  Topic Date Due   Zoster Vaccines- Shingrix (2 of 2) 04/19/2022   DTaP/Tdap/Td (2 - Td or Tdap) 08/28/2022   MAMMOGRAM  10/30/2022   Medicare Annual Wellness  (AWV)  03/01/2023   COVID-19 Vaccine (6 - 2023-24 season) 01/17/2023 (Originally 01/06/2023)   DEXA SCAN  05/17/2024   COLONOSCOPY (Pts 45-37yr Insurance coverage will need to be confirmed)  01/07/2026   Pneumonia Vaccine 72 Years old  Completed   INFLUENZA VACCINE  Completed   Hepatitis C Screening  Completed   HPV VACCINES  Aged Out   Immunization History  Administered Date(s) Administered   Fluad Quad(high Dose 65+) 07/21/2019, 08/25/2020, 11/11/2022   Influenza Whole 09/12/2018   Influenza, High Dose Seasonal PF 08/29/2021   PFIZER(Purple Top)SARS-COV-2 Vaccination 01/11/2020, 02/01/2020, 09/28/2020   Pfizer Covid-19 Vaccine Bivalent Booster 118yr& up 08/29/2021, 11/11/2022   Pneumococcal Conjugate-13 08/25/2020   Pneumococcal Polysaccharide-23 08/28/2012, 04/11/2017   Rotavirus,unspecified  11/11/2022   Tdap 08/28/2012   Zoster Recombinat (Shingrix) 02/22/2022   Zoster, Live 11/30/2012   We updated and reviewed the patient's past history in detail and it is documented below. Allergies: Patient is allergic to other. Past Medical History Patient  has a past medical history of Heart murmur, Osteoporosis, postmenopausal (05/17/2022), and Seasonal allergies. Past Surgical History Patient  has a past surgical history that includes Hammer toe surgery. Family History: Patient family history includes Cancer in her father and paternal grandmother; Heart disease in her mother; Hyperlipidemia in her mother; Hypertension in her father, maternal grandfather, maternal grandmother, and paternal grandfather. Social History:  Patient  reports that she has been smoking cigarettes. She has been smoking an average of .25 packs per day. She has never used smokeless tobacco. She reports that she does not drink alcohol and does not use drugs.  Review of Systems: Constitutional: negative for fever or malaise Ophthalmic: negative for photophobia, double vision or loss of vision Cardiovascular:  negative for chest pain, dyspnea on exertion, or new LE swelling Respiratory: negative for SOB or persistent cough Gastrointestinal: negative for abdominal pain, change in bowel habits or melena Genitourinary: negative for dysuria or gross hematuria, no abnormal uterine bleeding or disharge Musculoskeletal: negative for new gait disturbance or muscular weakness Integumentary: negative for new or persistent rashes, no breast lumps Neurological: negative for TIA or stroke symptoms Psychiatric: negative for SI or delusions Allergic/Immunologic: negative for hives  Patient Care Team    Relationship Specialty Notifications Start End  AnLeamon ArntMD PCP - General Family Medicine  08/25/20     Objective  Vitals: BP 136/80   Pulse 83   Temp 97.9 F (36.6 C)   Ht '5\' 1"'$  (1.549 m)   Wt 154 lb 12.8 oz (70.2 kg)   SpO2 95%   BMI 29.25 kg/m  General:  Well developed, well nourished, no acute distress  Psych:  Alert and orientedx3,normal mood and affect HEENT:  Normocephalic, atraumatic, non-icteric sclera,  supple neck without adenopathy, mass or thyromegaly Cardiovascular:  Normal S1, S2, RRR without gallop, rub or murmur Respiratory:  Good breath sounds bilaterally, CTAB with normal respiratory effort Gastrointestinal: normal bowel sounds, soft, non-tender, no noted masses. No HSM MSK: no deformities, contusions. Joints are without erythema or swelling.  Ext: no edema  Commons side effects,  risks, benefits, and alternatives for medications and treatment plan prescribed today were discussed, and the patient expressed understanding of the given instructions. Patient is instructed to call or message via MyChart if he/she has any questions or concerns regarding our treatment plan. No barriers to understanding were identified. We discussed Red Flag symptoms and signs in detail. Patient expressed understanding regarding what to do in case of urgent or emergency type symptoms.  Medication list  was reconciled, printed and provided to the patient in AVS. Patient instructions and summary information was reviewed with the patient as documented in the AVS. This note was prepared with assistance of Dragon voice recognition software. Occasional wrong-word or sound-a-like substitutions may have occurred due to the inherent limitations of voice recognition software

## 2023-01-02 LAB — COMPREHENSIVE METABOLIC PANEL
ALT: 15 U/L (ref 0–35)
AST: 17 U/L (ref 0–37)
Albumin: 3.8 g/dL (ref 3.5–5.2)
Alkaline Phosphatase: 89 U/L (ref 39–117)
BUN: 12 mg/dL (ref 6–23)
CO2: 27 mEq/L (ref 19–32)
Calcium: 9.5 mg/dL (ref 8.4–10.5)
Chloride: 104 mEq/L (ref 96–112)
Creatinine, Ser: 0.74 mg/dL (ref 0.40–1.20)
GFR: 81.61 mL/min (ref >60.00)
Glucose, Bld: 97 mg/dL (ref 70–99)
Potassium: 4.3 mEq/L (ref 3.5–5.1)
Sodium: 139 mEq/L (ref 135–145)
Total Bilirubin: 0.6 mg/dL (ref 0.2–1.2)
Total Protein: 6.7 g/dL (ref 6.0–8.3)

## 2023-01-02 LAB — CBC WITH DIFFERENTIAL/PLATELET
Basophils Absolute: 0 10*3/uL (ref 0.0–0.1)
Basophils Relative: 0.7 % (ref 0.0–3.0)
Eosinophils Absolute: 0.3 10*3/uL (ref 0.0–0.7)
Eosinophils Relative: 4.2 % (ref 0.0–5.0)
HCT: 45.2 % (ref 36.0–46.0)
Hemoglobin: 14.9 g/dL (ref 12.0–15.0)
Lymphocytes Relative: 18.8 % (ref 12.0–46.0)
Lymphs Abs: 1.4 10*3/uL (ref 0.7–4.0)
MCHC: 33 g/dL (ref 30.0–36.0)
MCV: 85 fl (ref 78.0–100.0)
Monocytes Absolute: 0.4 10*3/uL (ref 0.1–1.0)
Monocytes Relative: 4.8 % (ref 3.0–12.0)
Neutro Abs: 5.2 10*3/uL (ref 1.4–7.7)
Neutrophils Relative %: 71.5 % (ref 43.0–77.0)
Platelets: 347 10*3/uL (ref 150.0–400.0)
RBC: 5.32 Mil/uL — ABNORMAL HIGH (ref 3.87–5.11)
RDW: 14.4 % (ref 11.5–15.5)
WBC: 7.3 10*3/uL (ref 4.0–10.5)

## 2023-01-02 LAB — LIPID PANEL
Cholesterol: 168 mg/dL (ref 0–200)
HDL: 54.2 mg/dL (ref >39.00)
LDL Cholesterol: 83 mg/dL (ref 0–99)
NonHDL: 113.42
Total CHOL/HDL Ratio: 3
Triglycerides: 153 mg/dL — ABNORMAL HIGH (ref 0.0–149.0)
VLDL: 30.6 mg/dL (ref 0.0–40.0)

## 2023-01-02 NOTE — Telephone Encounter (Signed)
Prolia VOB initiated via [email or web address] 

## 2023-01-06 ENCOUNTER — Other Ambulatory Visit (HOSPITAL_COMMUNITY): Payer: Self-pay

## 2023-01-08 ENCOUNTER — Other Ambulatory Visit (HOSPITAL_COMMUNITY): Payer: Self-pay

## 2023-01-08 NOTE — Telephone Encounter (Signed)
   Submitted a prior auth to [facility] for medical benefits.

## 2023-01-13 ENCOUNTER — Other Ambulatory Visit (HOSPITAL_COMMUNITY): Payer: Self-pay

## 2023-01-13 NOTE — Telephone Encounter (Addendum)
Pharmacy Patient Advocate Encounter  Received notification from [facility] that the request for prior authorization for Prolia 60mg  has been denied due to not meeting criteria for medical therapy.      Please be advised we currently do not have a Pharmacist to review denials, therefore you will need to process appeals accordingly as needed. Thanks for your support at this time.   You may call [phone] or fax [phone], to appeal.  Denial letter attached to charts

## 2023-02-03 ENCOUNTER — Other Ambulatory Visit (HOSPITAL_COMMUNITY): Payer: Self-pay

## 2023-02-03 NOTE — Telephone Encounter (Signed)
Pt ready for scheduling for Prolia on or after : 02/03/23  Out-of-pocket cost due at time of visit: $352  Primary: [employer] Medicare Prolia co-insurance: 20% Admin fee co-insurance: $50  Secondary: --- Prolia co-insurance:  Admin fee co-insurance:   Medical Benefit Details: Date Benefits were checked: 01/08/23 Deductible: NO/ Coinsurance: 20%/ Admin Fee: $50  Prior Auth: APPROVED PA# [id number]  Expiration Date: 02/03/24    Pharmacy benefit: Copay $470 If patient wants fill through the pharmacy benefit please send prescription to: [facility], and include estimated need by date in rx notes. Pharmacy will ship medication directly to the office.  Patient NOT eligible for Prolia Copay Card. Copay Card can make patient's cost as little as $25. Link to apply: [email or web address]://[email or web address]  ** This summary of benefits is an estimation of the patient's out-of-pocket cost. Exact cost may very based on individual plan coverage.

## 2023-02-03 NOTE — Telephone Encounter (Signed)
Authorization Number: [id number]

## 2023-02-04 NOTE — Telephone Encounter (Signed)
Vml for pt to call back and sch prolia shot - please add oop amount on apt note

## 2023-02-10 ENCOUNTER — Ambulatory Visit
Admission: RE | Admit: 2023-02-10 | Discharge: 2023-02-10 | Disposition: A | Discharge status: Home / Self Care | Payer: Medicare Other | Source: Ambulatory Visit | Attending: Family Medicine | Admitting: Family Medicine

## 2023-02-10 DIAGNOSIS — Z1231 Encounter for screening mammogram for malignant neoplasm of breast: Secondary | ICD-10-CM | POA: Diagnosis not present

## 2023-02-24 ENCOUNTER — Telehealth: Payer: Self-pay | Admitting: Family Medicine

## 2023-02-24 NOTE — Telephone Encounter (Signed)
Copied from CRM [id number]. Topic: Medicare AWV >> Feb 24, 2023 10:05 AM Gwenith Spitz wrote: Reason for CRM: Called patient to schedule Medicare Annual Wellness Visit (AWV). Left message for patient to call back and schedule Medicare Annual Wellness Visit (AWV).  Last date of AWV: 02/28/2022  Please schedule an appointment at any time with Inetta Fermo, [facility]. Please schedule AWVS with Inetta Fermo, [facility].  If any questions, please contact me at [phone].  Thank you ,  Gabriel Cirri [facility] AWV TEAM Direct Dial [phone]

## 2023-02-24 NOTE — Telephone Encounter (Signed)
Contacted Meghan Cox to schedule their annual wellness visit. Appointment made for 03/03/2023.  Gabriel Cirri [facility] AWV TEAM Direct Dial [phone]

## 2023-03-03 ENCOUNTER — Ambulatory Visit (INDEPENDENT_AMBULATORY_CARE_PROVIDER_SITE_OTHER): Payer: Medicare Other

## 2023-03-03 VITALS — Wt 154.0 lb

## 2023-03-03 DIAGNOSIS — Z0000 Encounter for general adult medical examination without abnormal findings: Secondary | ICD-10-CM

## 2023-03-03 DIAGNOSIS — Z122 Encounter for screening for malignant neoplasm of respiratory organs: Secondary | ICD-10-CM

## 2023-03-03 NOTE — Patient Instructions (Signed)
[name] , Thank you for taking time to come for your Medicare Wellness Visit. I appreciate your ongoing commitment to your health goals. Please review the following plan we discussed and let me know if I can assist you in the future.   These are the goals we discussed:  Goals      Patient Stated     Keeping weight down      Patient Stated     Lose moe weight        This is a list of the screening recommended for you and due dates:  Health Maintenance  Topic Date Due   Zoster (Shingles) Vaccine (2 of 2) 04/19/2022   DTaP/Tdap/Td vaccine (2 - Td or Tdap) 08/28/2022   COVID-19 Vaccine (6 - 2023-24 season) 01/06/2023   Flu Shot  05/29/2023   Mammogram  02/10/2024   Medicare Annual Wellness Visit  03/02/2024   DEXA scan (bone density measurement)  05/17/2024   Colon Cancer Screening  01/07/2026   Pneumonia Vaccine  Completed   Hepatitis C Screening: USPSTF Recommendation to screen - Ages 18-79 yo.  Completed   HPV Vaccine  Aged Out    Advanced directives: Please bring a copy of your health care power of attorney and living will to the office at your convenience.  Conditions/risks identified: lose some weight   Next appointment: Follow up in one year for your annual wellness visit     Preventive Care 65 Years and Older, Female Preventive care refers to lifestyle choices and visits with your health care provider that can promote health and wellness. What does preventive care include? A yearly physical exam. This is also called an annual well check. Dental exams once or twice a year. Routine eye exams. Ask your health care provider how often you should have your eyes checked. Personal lifestyle choices, including: Daily care of your teeth and gums. Regular physical activity. Eating a healthy diet. Avoiding tobacco and drug use. Limiting alcohol use. Practicing safe sex. Taking low-dose aspirin every day. Taking vitamin and mineral supplements as recommended by your  health care provider. What happens during an annual well check? The services and screenings done by your health care provider during your annual well check will depend on your age, overall health, lifestyle risk factors, and family history of disease. Counseling  Your health care provider may ask you questions about your: Alcohol use. Tobacco use. Drug use. Emotional well-being. Home and relationship well-being. Sexual activity. Eating habits. History of falls. Memory and ability to understand (cognition). Work and work Astronomer. Reproductive health. Screening  You may have the following tests or measurements: Height, weight, and BMI. Blood pressure. Lipid and cholesterol levels. These may be checked every 5 years, or more frequently if you are over 8 years old. Skin check. Lung cancer screening. You may have this screening every year starting at age 40 if you have a 30-pack-year history of smoking and currently smoke or have quit within the past 15 years. Fecal occult blood test (FOBT) of the stool. You may have this test every year starting at age 11. Flexible sigmoidoscopy or colonoscopy. You may have a sigmoidoscopy every 5 years or a colonoscopy every 10 years starting at age 52. Hepatitis C blood test. Hepatitis B blood test. Sexually transmitted disease (STD) testing. Diabetes screening. This is done by checking your blood sugar (glucose) after you have not eaten for a while (fasting). You may have this done every 1-3 years. Bone density scan. This is  done to screen for osteoporosis. You may have this done starting at age 79. Mammogram. This may be done every 1-2 years. Talk to your health care provider about how often you should have regular mammograms. Talk with your health care provider about your test results, treatment options, and if necessary, the need for more tests. Vaccines  Your health care provider may recommend certain vaccines, such as: Influenza vaccine.  This is recommended every year. Tetanus, diphtheria, and acellular pertussis (Tdap, Td) vaccine. You may need a Td booster every 10 years. Zoster vaccine. You may need this after age 65. Pneumococcal 13-valent conjugate (PCV13) vaccine. One dose is recommended after age 41. Pneumococcal polysaccharide (PPSV23) vaccine. One dose is recommended after age 65. Talk to your health care provider about which screenings and vaccines you need and how often you need them. This information is not intended to replace advice given to you by your health care provider. Make sure you discuss any questions you have with your health care provider. Document Released: 11/10/2015 Document Revised: 07/03/2016 Document Reviewed: 08/15/2015 Elsevier Interactive Patient Education  2017 [place] Prevention in the Home Falls can cause injuries. They can happen to people of all ages. There are many things you can do to make your home safe and to help prevent falls. What can I do on the outside of my home? Regularly fix the edges of walkways and driveways and fix any cracks. Remove anything that might make you trip as you walk through a door, such as a raised step or threshold. Trim any bushes or trees on the path to your home. Use bright outdoor lighting. Clear any walking paths of anything that might make someone trip, such as rocks or tools. Regularly check to see if handrails are loose or broken. Make sure that both sides of any steps have handrails. Any raised decks and porches should have guardrails on the edges. Have any leaves, snow, or ice cleared regularly. Use sand or salt on walking paths during winter. Clean up any spills in your garage right away. This includes oil or grease spills. What can I do in the bathroom? Use night lights. Install grab bars by the toilet and in the tub and shower. Do not use towel bars as grab bars. Use non-skid mats or decals in the tub or shower. If you need to sit  down in the shower, use a plastic, non-slip stool. Keep the floor dry. Clean up any water that spills on the floor as soon as it happens. Remove soap buildup in the tub or shower regularly. Attach bath mats securely with double-sided non-slip rug tape. Do not have throw rugs and other things on the floor that can make you trip. What can I do in the bedroom? Use night lights. Make sure that you have a light by your bed that is easy to reach. Do not use any sheets or blankets that are too big for your bed. They should not hang down onto the floor. Have a firm chair that has side arms. You can use this for support while you get dressed. Do not have throw rugs and other things on the floor that can make you trip. What can I do in the kitchen? Clean up any spills right away. Avoid walking on wet floors. Keep items that you use a lot in easy-to-reach places. If you need to reach something above you, use a strong step stool that has a grab bar. Keep electrical cords out of  the way. Do not use floor polish or wax that makes floors slippery. If you must use wax, use non-skid floor wax. Do not have throw rugs and other things on the floor that can make you trip. What can I do with my stairs? Do not leave any items on the stairs. Make sure that there are handrails on both sides of the stairs and use them. Fix handrails that are broken or loose. Make sure that handrails are as long as the stairways. Check any carpeting to make sure that it is firmly attached to the stairs. Fix any carpet that is loose or worn. Avoid having throw rugs at the top or bottom of the stairs. If you do have throw rugs, attach them to the floor with carpet tape. Make sure that you have a light switch at the top of the stairs and the bottom of the stairs. If you do not have them, ask someone to add them for you. What else can I do to help prevent falls? Wear shoes that: Do not have high heels. Have rubber bottoms. Are  comfortable and fit you well. Are closed at the toe. Do not wear sandals. If you use a stepladder: Make sure that it is fully opened. Do not climb a closed stepladder. Make sure that both sides of the stepladder are locked into place. Ask someone to hold it for you, if possible. Clearly mark and make sure that you can see: Any grab bars or handrails. First and last steps. Where the edge of each step is. Use tools that help you move around (mobility aids) if they are needed. These include: Canes. Walkers. Scooters. Crutches. Turn on the lights when you go into a dark area. Replace any light bulbs as soon as they burn out. Set up your furniture so you have a clear path. Avoid moving your furniture around. If any of your floors are uneven, fix them. If there are any pets around you, be aware of where they are. Review your medicines with your doctor. Some medicines can make you feel dizzy. This can increase your chance of falling. Ask your doctor what other things that you can do to help prevent falls. This information is not intended to replace advice given to you by your health care provider. Make sure you discuss any questions you have with your health care provider. Document Released: 08/10/2009 Document Revised: 03/21/2016 Document Reviewed: 11/18/2014 Elsevier Interactive Patient Education  2017 [employer].

## 2023-03-03 NOTE — Progress Notes (Addendum)
I connected with  Meghan Cox on 03/03/23 by a audio enabled telemedicine application and verified that I am speaking with the correct person using two identifiers.  Patient Location: Home  Provider Location: Home Office  I discussed the limitations of evaluation and management by telemedicine. The patient expressed understanding and agreed to proceed.   Patient Medicare AWV questionnaire was completed by the patient on 02/27/23; I have confirmed that all information answered by patient is correct and no changes since this date.      Subjective:   Meghan Cox is a 72 y.o. female who presents for Medicare Annual (Subsequent) preventive examination.  Review of Systems     Cardiac Risk Factors include: advanced age (>96men, >20 women);dyslipidemia;smoking/ tobacco exposure     Objective:    Today's Vitals   03/03/23 0835  Weight: 154 lb (69.9 kg)   Body mass index is 29.1 kg/m.     03/03/2023    8:42 AM 02/28/2022    9:50 AM 11/26/2021    9:33 AM  Advanced Directives  Does Patient Have a Medical Advance Directive? No No No  Would patient like information on creating a medical advance directive? No - Patient declined No - Patient declined     Current Medications (verified) Outpatient Encounter Medications as of 03/03/2023  Medication Sig   AREXVY 120 MCG/0.5ML injection    atorvastatin (LIPITOR) 20 MG tablet TAKE 1 TABLET BY MOUTH EVERY DAY   Calcium Carb-Cholecalciferol (CALCIUM 600+D3 PO) Take by mouth 2 (two) times daily. Calcium 1200   FLUAD QUADRIVALENT 0.5 ML injection    loratadine (CLARITIN) 10 MG tablet Take 10 mg by mouth daily.   SPIKEVAX syringe    [DISCONTINUED] calcium carbonate (OS-CAL - DOSED IN MG OF ELEMENTAL CALCIUM) 1250 (500 Ca) MG tablet Take 1 tablet by mouth.   No facility-administered encounter medications on file as of 03/03/2023.    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    Seasonal allergies    Past Surgical History:  Procedure Laterality Date   HAMMER TOE SURGERY     THUMB ARTHROSCOPY Right 05/31/2022   Family History  Problem Relation Age of Onset   Heart disease Mother    Hyperlipidemia Mother    Cancer Father        Prostate   Hypertension Father    Hypertension Maternal Grandmother    Hypertension Maternal Grandfather    Cancer Paternal Grandmother        Breast   Hypertension Paternal Grandfather    Social History   Socioeconomic History   Marital status: Married    Spouse name: Not on file   Number of children: Not on file   Years of education: Not on file   Highest education level: Not on file  Occupational History   Not on file  Tobacco Use   Smoking status: Every Day    Packs/day: .25    Types: Cigarettes   Smokeless tobacco: Never  Vaping Use   Vaping Use: Never used  Substance and Sexual Activity   Alcohol use: No   Drug use: No   Sexual activity: Yes  Other Topics Concern   Not on file  Social History Narrative   Not on file   Social Determinants of Health   Financial Resource Strain: Low Risk  (02/27/2023)   Overall Financial  Resource Strain (CARDIA)    Difficulty of Paying Living Expenses: Not hard at all  Food Insecurity: No Food Insecurity (02/27/2023)   Hunger Vital Sign    Worried About Running Out of Food in the Last Year: Never true    Ran Out of Food in the Last Year: Never true  Transportation Needs: No Transportation Needs (02/27/2023)   PRAPARE - Administrator, Civil Service (Medical): No    Lack of Transportation (Non-Medical): No  Physical Activity: Insufficiently Active (02/27/2023)   Exercise Vital Sign    Days of Exercise per Week: 1 day    Minutes of Exercise per Session: 30 min  Stress: No Stress Concern Present (02/27/2023)   [employer] of Occupational Health - Occupational Stress  Questionnaire    Feeling of Stress : Not at all  Social Connections: Moderately Isolated (02/27/2023)   Social Connection and Isolation Panel [NHANES]    Frequency of Communication with Friends and Family: Once a week    Frequency of Social Gatherings with Friends and Family: Once a week    Attends Religious Services: 1 to 4 times per year    Active Member of [employer] or Organizations: No    Attends Engineer, structural: Never    Marital Status: Married    Tobacco Counseling Ready to quit: Not Answered Counseling given: Not Answered   Clinical Intake:  Pre-visit preparation completed: Yes  Pain : No/denies pain     BMI - recorded: 29.1 Nutritional Status: BMI 25 -29 Overweight Nutritional Risks: None Diabetes: No  How often do you need to have someone help you when you read instructions, pamphlets, or other written materials from your doctor or pharmacy?: (P) 1 - Never  Diabetic?no  Interpreter Needed?: No  Information entered by :: Lanier Ensign, LPN   Activities of Daily Living    02/27/2023    2:13 PM  In your present state of health, do you have any difficulty performing the following activities:  Hearing? 0  Vision? 0  Difficulty concentrating or making decisions? 0  Walking or climbing stairs? 0  Dressing or bathing? 0  Doing errands, shopping? 0  Preparing Food and eating ? N  Using the Toilet? N  In the past six months, have you accidently leaked urine? N  Do you have problems with loss of bowel control? N  Managing your Medications? N  Managing your Finances? N  Housekeeping or managing your Housekeeping? N    Patient Care Team: Willow Ora, MD as PCP - General (Family Medicine)  Indicate any recent Medical Services you may have received from other than [facility] providers in the past year (date may be approximate).     Assessment:   This is a routine wellness examination for Meghan Cox.  Hearing/Vision screen Hearing Screening - Comments::  Pt denies any hearing issues  Vision Screening - Comments:: Pt follows up with Dr Randon Goldsmith for annual eye exams   Dietary issues and exercise activities discussed: Current Exercise Habits: Home exercise routine, Type of exercise: Other - see comments, Time (Minutes): 30, Frequency (Times/Week): 1, Weekly Exercise (Minutes/Week): 30   Goals Addressed             This Visit's Progress    Patient Stated       Lose more weight       Depression Screen    03/03/2023    8:40 AM 01/01/2023    1:58 PM 08/14/2022    9:22 AM 05/29/2022  2:11 PM 02/28/2022    9:49 AM 11/26/2021    9:35 AM 08/25/2020   11:19 AM  PHQ 2/9 Scores  PHQ - 2 Score 0 0 0 0 0 0 0  PHQ- 9 Score      0     Fall Risk    02/27/2023    2:13 PM 01/01/2023    1:58 PM 05/29/2022    2:11 PM 11/26/2021    9:35 AM 11/26/2021    9:32 AM  Fall Risk   Falls in the past year? 0 0 0  0  Number falls in past yr:  0 0  0  Injury with Fall?  0 0  0  Risk for fall due to : Impaired vision No Fall Risks No Fall Risks No Fall Risks No Fall Risks  Follow up Falls prevention discussed Falls evaluation completed Falls evaluation completed      FALL RISK PREVENTION PERTAINING TO THE HOME:  Any stairs in or around the home? Yes  If so, are there any without handrails? No  Home free of loose throw rugs in walkways, pet beds, electrical cords, etc? Yes  Adequate lighting in your home to reduce risk of falls? Yes   ASSISTIVE DEVICES UTILIZED TO PREVENT FALLS:  Life alert? No  Use of a cane, walker or w/c? No  Grab bars in the bathroom? No  Shower chair or bench in shower? No  Elevated toilet seat or a handicapped toilet? No   TIMED UP AND GO:  Was the test performed? No .   Cognitive Function:        03/03/2023    8:44 AM  6CIT Screen  What Year? 0 points  What month? 0 points  What time? 0 points  Count back from 20 0 points  Months in reverse 0 points  Repeat phrase 0 points  Total Score 0 points     Immunizations Immunization History  Administered Date(s) Administered   Fluad Quad(high Dose 65+) 07/21/2019, 08/25/2020, 11/11/2022   Influenza Whole 09/12/2018   Influenza, High Dose Seasonal PF 08/29/2021   PFIZER(Purple Top)SARS-COV-2 Vaccination 01/11/2020, 02/01/2020, 09/28/2020   Pfizer Covid-19 Vaccine Bivalent Booster 13yrs & up 08/29/2021, 11/11/2022   Pneumococcal Conjugate-13 08/25/2020   Pneumococcal Polysaccharide-23 08/28/2012, 04/11/2017   Rotavirus,unspecified  11/11/2022   Tdap 08/28/2012   Zoster Recombinat (Shingrix) 02/22/2022   Zoster, Live 11/30/2012    TDAP status: Due, Education has been provided regarding the importance of this vaccine. Advised may receive this vaccine at local pharmacy or Health Dept. Aware to provide a copy of the vaccination record if obtained from local pharmacy or Health Dept. Verbalized acceptance and understanding.  Flu Vaccine status: Up to date  Pneumococcal vaccine status: Up to date  Covid-19 vaccine status: Completed vaccines  Qualifies for Shingles Vaccine? Yes   Zostavax completed Yes   Shingrix Completed?: No.    Education has been provided regarding the importance of this vaccine. Patient has been advised to call insurance company to determine out of pocket expense if they have not yet received this vaccine. Advised may also receive vaccine at local pharmacy or Health Dept. Verbalized acceptance and understanding.  Screening Tests Health Maintenance  Topic Date Due   Zoster Vaccines- Shingrix (2 of 2) 04/19/2022   DTaP/Tdap/Td (2 - Td or Tdap) 08/28/2022   COVID-19 Vaccine (6 - 2023-24 season) 01/06/2023   INFLUENZA VACCINE  05/29/2023   MAMMOGRAM  02/10/2024   Medicare Annual Wellness (AWV)  03/02/2024  DEXA SCAN  05/17/2024   COLONOSCOPY (Pts 45-83yrs Insurance coverage will need to be confirmed)  01/07/2026   Pneumonia Vaccine 11+ Years old  Completed   Hepatitis C Screening  Completed   HPV VACCINES  Aged  Out    Health Maintenance  Health Maintenance Due  Topic Date Due   Zoster Vaccines- Shingrix (2 of 2) 04/19/2022   DTaP/Tdap/Td (2 - Td or Tdap) 08/28/2022   COVID-19 Vaccine (6 - 2023-24 season) 01/06/2023    Colorectal cancer screening: Type of screening: Colonoscopy. Completed 01/08/16. Repeat every 10 years  Mammogram status: Completed 02/10/23. Repeat every year  Bone Density status: Completed 05/17/22. Results reflect: Bone density results: OSTEOPOROSIS. Repeat every 2 years.  Lung Cancer Screening: (Low Dose CT Chest recommended if Age 69-80 years, 30 pack-year currently smoking OR have quit w/in 15years.) does qualify.   Lung Cancer Screening Referral: referral placed 03/02/24  Additional Screening:  Hepatitis C Screening: Completed 04/17/18  Vision Screening: Recommended annual ophthalmology exams for early detection of glaucoma and other disorders of the eye. Is the patient up to date with their annual eye exam?  Yes  Who is the provider or what is the name of the office in which the patient attends annual eye exams? Dr Randon Goldsmith  If pt is not established with a provider, would they like to be referred to a provider to establish care? No .   Dental Screening: Recommended annual dental exams for proper oral hygiene  Community Resource Referral / Chronic Care Management: CRR required this visit?  No   CCM required this visit?  No      Plan:     I have personally reviewed and noted the following in the patient's chart:   Medical and social history Use of alcohol, tobacco or illicit drugs  Current medications and supplements including opioid prescriptions. Patient is not currently taking opioid prescriptions. Functional ability and status Nutritional status Physical activity Advanced directives List of other physicians Hospitalizations, surgeries, and ER visits in previous 12 months Vitals Screenings to include cognitive, depression, and falls Referrals and  appointments  In addition, I have reviewed and discussed with patient certain preventive protocols, quality metrics, and best practice recommendations. A written personalized care plan for preventive services as well as general preventive health recommendations were provided to patient.     Marzella Schlein, LPN   11/02/1094   Nurse Notes: none

## 2023-07-09 DIAGNOSIS — H25013 Cortical age-related cataract, bilateral: Secondary | ICD-10-CM | POA: Diagnosis not present

## 2023-07-09 DIAGNOSIS — H2513 Age-related nuclear cataract, bilateral: Secondary | ICD-10-CM | POA: Diagnosis not present

## 2023-07-09 DIAGNOSIS — H52203 Unspecified astigmatism, bilateral: Secondary | ICD-10-CM | POA: Diagnosis not present

## 2023-10-10 IMAGING — MG MM DIGITAL SCREENING BILAT W/ TOMO AND CAD
8 series · 8 of 24 positions shown · non-contrast
Comparison: Previous exam(s).

ACR Breast Density Category a: The breast tissue is almost entirely
fatty.

CLINICAL DATA: Screening.

EXAM:
DIGITAL SCREENING BILATERAL MAMMOGRAM WITH TOMOSYNTHESIS AND CAD
TECHNIQUE: Bilateral screening digital craniocaudal and mediolateral oblique
mammograms were obtained. Bilateral screening digital breast
tomosynthesis was performed. The images were evaluated with
computer-aided detection.

[L CC synth-2D]
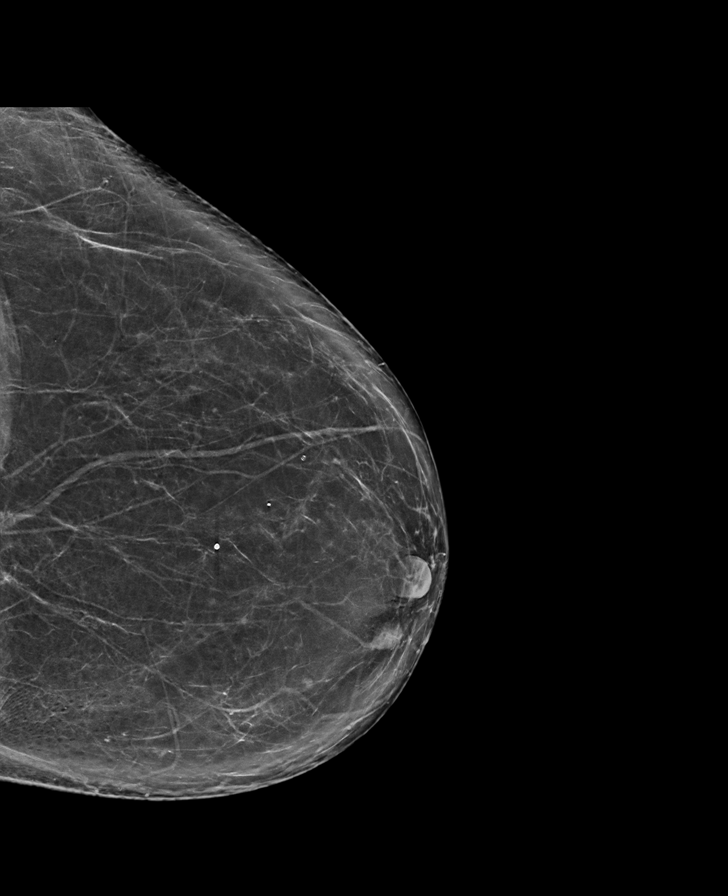

[R MLO synth-2D]
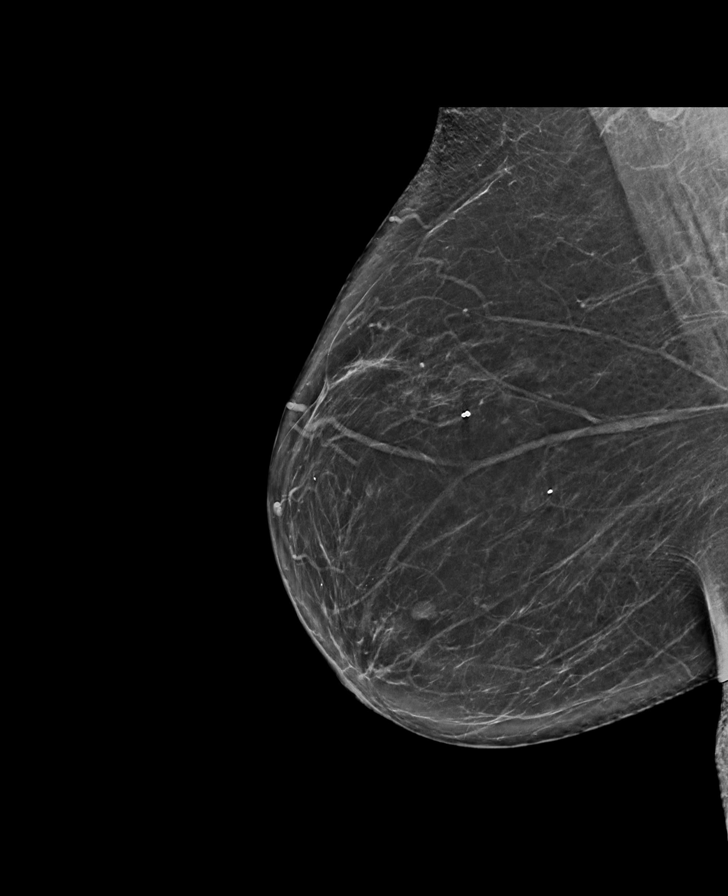

[L MLO synth-2D]
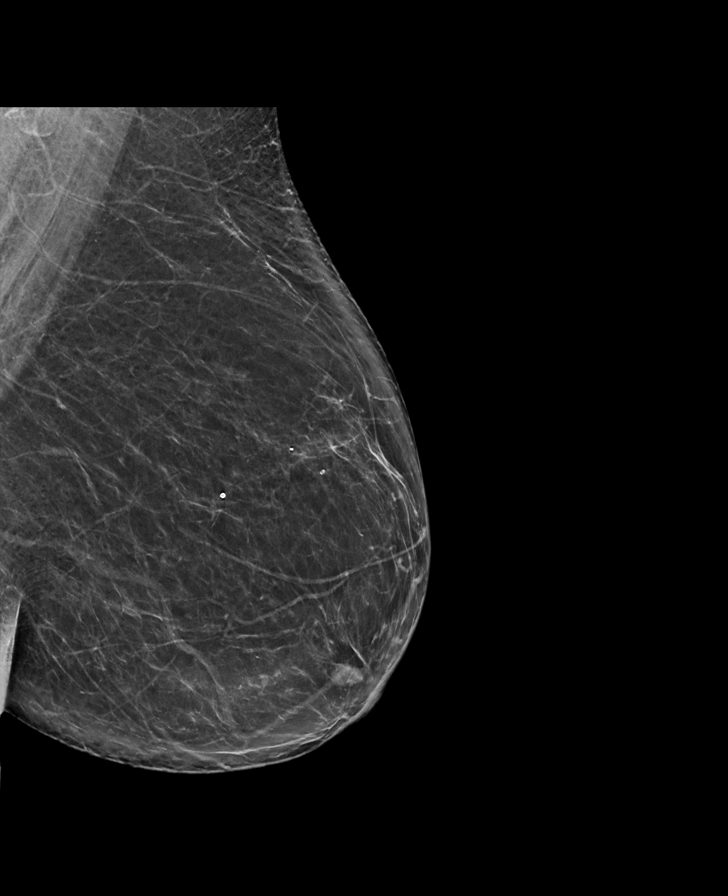

[R CC synth-2D]
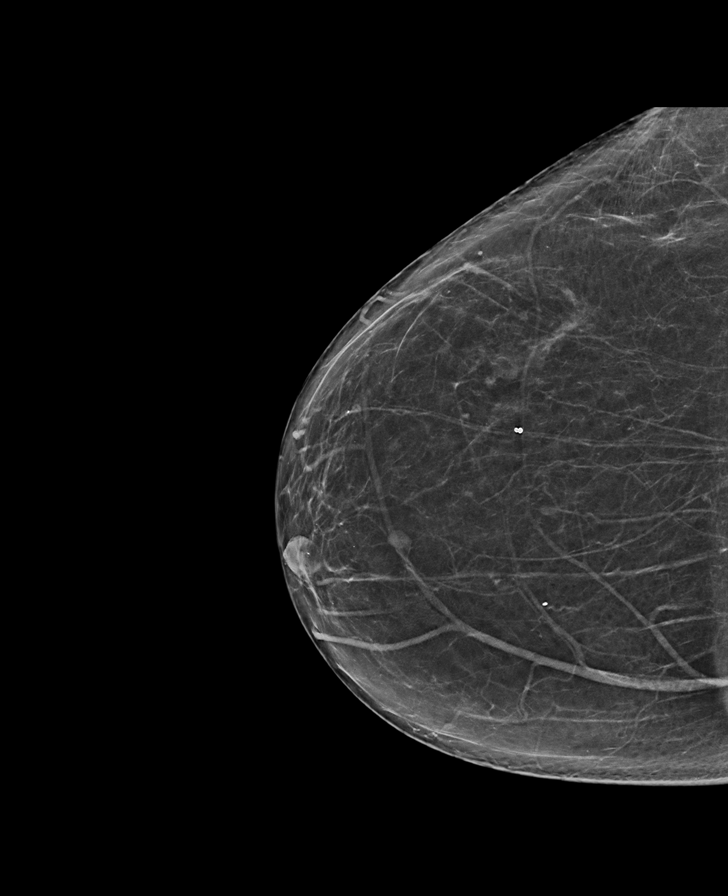

[L MLO tomo · tomo slice 41/82.0]
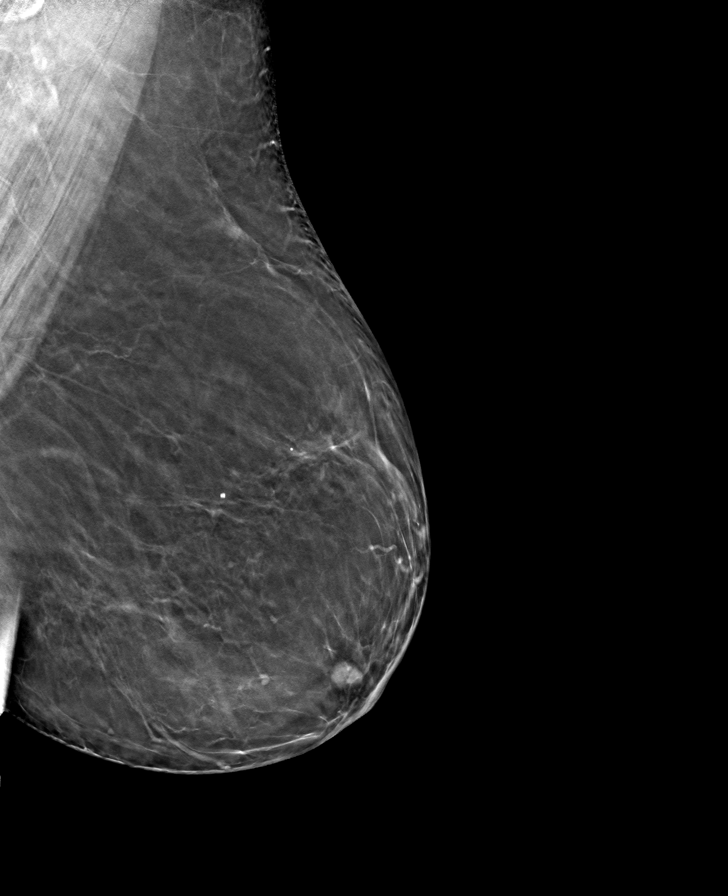

[R CC tomo · tomo slice 38/75.0]
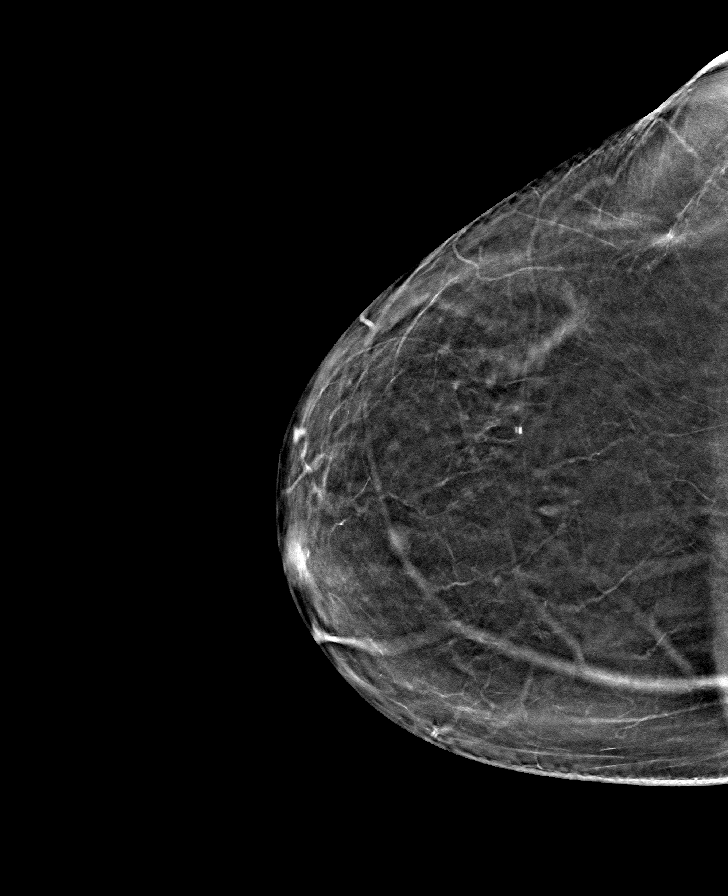

[L CC tomo · tomo slice 43/84.0]
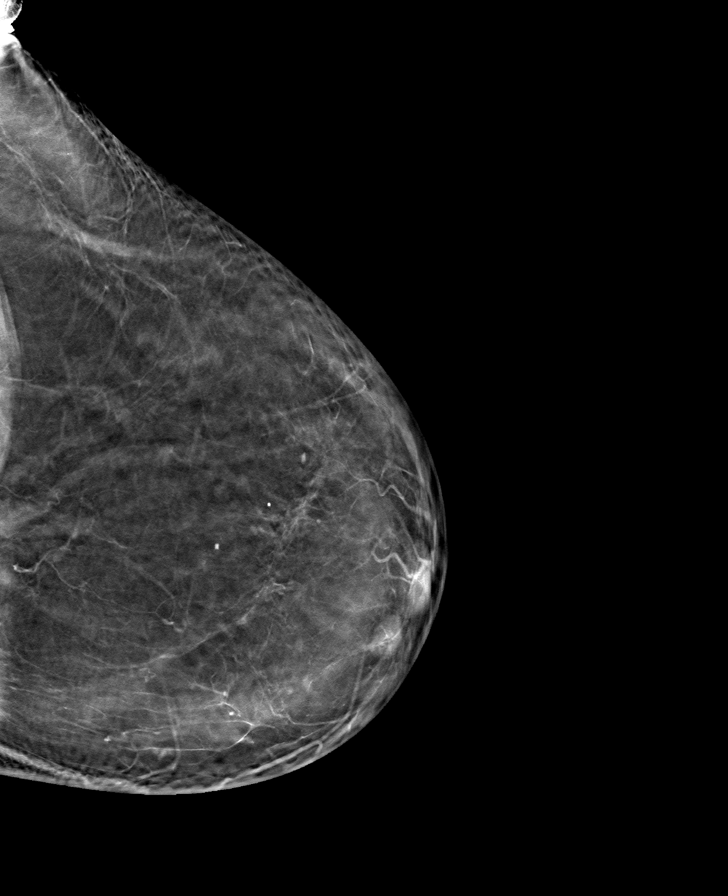

[R MLO tomo · tomo slice 39/77.0]
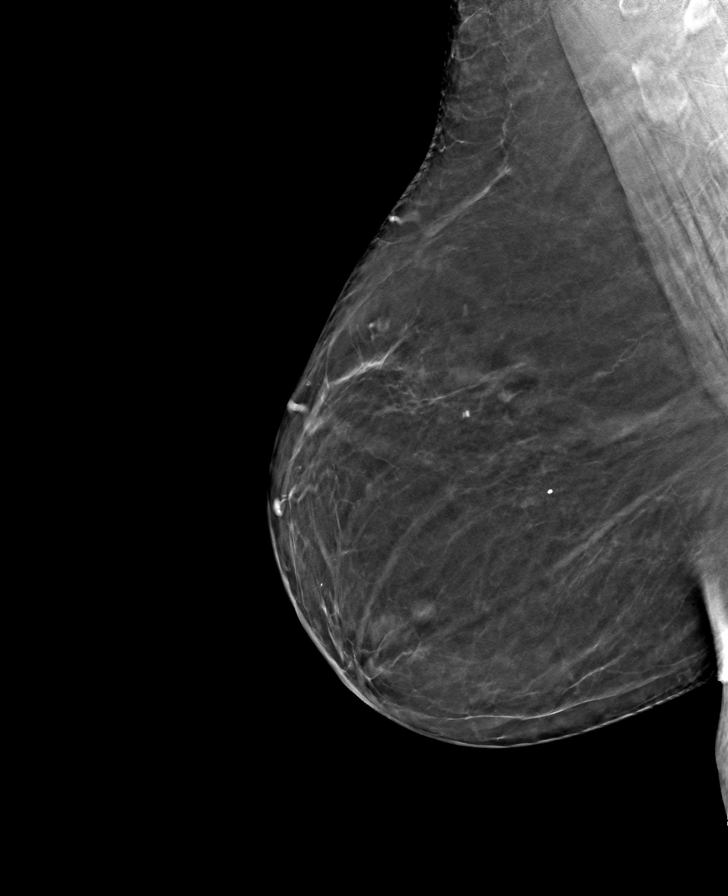

[8 of 24 positions shown; findings below may reference images not displayed]

FINDINGS: There are no findings suspicious for malignancy.
IMPRESSION: No mammographic evidence of malignancy. A result letter of this
screening mammogram will be mailed directly to the patient.

RECOMMENDATION:
Screening mammogram in one year. (Code:0E-3-N98)

BI-RADS CATEGORY  1: Negative.

## 2023-10-24 ENCOUNTER — Other Ambulatory Visit: Payer: Self-pay | Admitting: Family Medicine

## 2023-12-16 ENCOUNTER — Encounter: Payer: Self-pay | Admitting: *Deleted

## 2024-01-02 ENCOUNTER — Ambulatory Visit: Payer: Medicare Other | Admitting: Family Medicine

## 2024-01-02 ENCOUNTER — Encounter: Payer: Self-pay | Admitting: Family Medicine

## 2024-01-02 VITALS — BP 127/83 | HR 76 | Temp 97.8°F | Ht 61.0 in | Wt 156.2 lb

## 2024-01-02 DIAGNOSIS — F17210 Nicotine dependence, cigarettes, uncomplicated: Secondary | ICD-10-CM

## 2024-01-02 DIAGNOSIS — Z0001 Encounter for general adult medical examination with abnormal findings: Secondary | ICD-10-CM | POA: Diagnosis not present

## 2024-01-02 DIAGNOSIS — Z780 Asymptomatic menopausal state: Secondary | ICD-10-CM

## 2024-01-02 DIAGNOSIS — R030 Elevated blood-pressure reading, without diagnosis of hypertension: Secondary | ICD-10-CM | POA: Diagnosis not present

## 2024-01-02 DIAGNOSIS — M4727 Other spondylosis with radiculopathy, lumbosacral region: Secondary | ICD-10-CM | POA: Diagnosis not present

## 2024-01-02 DIAGNOSIS — E782 Mixed hyperlipidemia: Secondary | ICD-10-CM

## 2024-01-02 DIAGNOSIS — M810 Age-related osteoporosis without current pathological fracture: Secondary | ICD-10-CM

## 2024-01-02 DIAGNOSIS — E559 Vitamin D deficiency, unspecified: Secondary | ICD-10-CM

## 2024-01-02 LAB — LIPID PANEL
Cholesterol: 154 mg/dL (ref 0–200)
HDL: 47.4 mg/dL (ref >39.00)
LDL Cholesterol: 77 mg/dL (ref 0–99)
NonHDL: 106.53
Total CHOL/HDL Ratio: 3
Triglycerides: 148 mg/dL (ref 0.0–149.0)
VLDL: 29.6 mg/dL (ref 0.0–40.0)

## 2024-01-02 LAB — CBC WITH DIFFERENTIAL/PLATELET
Basophils Absolute: 0 10*3/uL (ref 0.0–0.1)
Basophils Relative: 0.3 % (ref 0.0–3.0)
Eosinophils Absolute: 0.6 10*3/uL (ref 0.0–0.7)
Eosinophils Relative: 9 % — ABNORMAL HIGH (ref 0.0–5.0)
HCT: 44.2 % (ref 36.0–46.0)
Hemoglobin: 14.5 g/dL (ref 12.0–15.0)
Lymphocytes Relative: 24 % (ref 12.0–46.0)
Lymphs Abs: 1.7 10*3/uL (ref 0.7–4.0)
MCHC: 32.8 g/dL (ref 30.0–36.0)
MCV: 86 fl (ref 78.0–100.0)
Monocytes Absolute: 0.5 10*3/uL (ref 0.1–1.0)
Monocytes Relative: 6.8 % (ref 3.0–12.0)
Neutro Abs: 4.2 10*3/uL (ref 1.4–7.7)
Neutrophils Relative %: 59.9 % (ref 43.0–77.0)
Platelets: 278 10*3/uL (ref 150.0–400.0)
RBC: 5.13 Mil/uL — ABNORMAL HIGH (ref 3.87–5.11)
RDW: 14.5 % (ref 11.5–15.5)
WBC: 7 10*3/uL (ref 4.0–10.5)

## 2024-01-02 LAB — COMPREHENSIVE METABOLIC PANEL
ALT: 16 U/L (ref 0–35)
AST: 16 U/L (ref 0–37)
Albumin: 3.9 g/dL (ref 3.5–5.2)
Alkaline Phosphatase: 85 U/L (ref 39–117)
BUN: 16 mg/dL (ref 6–23)
CO2: 24 meq/L (ref 19–32)
Calcium: 9.2 mg/dL (ref 8.4–10.5)
Chloride: 105 meq/L (ref 96–112)
Creatinine, Ser: 0.69 mg/dL (ref 0.40–1.20)
GFR: 86.93 mL/min (ref >60.00)
Glucose, Bld: 70 mg/dL (ref 70–99)
Potassium: 3.6 meq/L (ref 3.5–5.1)
Sodium: 142 meq/L (ref 135–145)
Total Bilirubin: 0.6 mg/dL (ref 0.2–1.2)
Total Protein: 7 g/dL (ref 6.0–8.3)

## 2024-01-02 LAB — TSH: TSH: 3.95 u[IU]/mL (ref 0.35–5.50)

## 2024-01-02 LAB — VITAMIN D 25 HYDROXY (VIT D DEFICIENCY, FRACTURES): VITD: 10.77 ng/mL — ABNORMAL LOW (ref 30.00–100.00)

## 2024-01-02 MED ORDER — ATORVASTATIN CALCIUM 20 MG PO TABS: 20.0000 mg | ORAL_TABLET | Freq: Every day | ORAL | 3 refills | Status: AC

## 2024-01-02 NOTE — Progress Notes (Signed)
 Subjective  Chief Complaint  Patient presents with   Annual Exam    Pt here for Annual Exam and is currently fasting    Osteoporosis    HPI: Meghan Cox is a 73 y.o. female who presents to [facility] [place] [facility] today for a Female Wellness Visit. She also has the concerns and/or needs as listed above in the chief complaint. These will be addressed in addition to the Health Maintenance Visit.   Wellness Visit: annual visit with health maintenance review and exam  Health maintenance: Mammogram due in April.  Colonoscopy screens are current.  Feeling well.  Retired.  Immunizations current.  Last week had flu shot, COVID and second Shingrix.  Still eligible for RSV Chronic disease f/u and/or acute problem visit: (deemed necessary to be done in addition to the wellness visit): Smoking cessation: She has done a great job.  Smokes 2 to 3 cigarettes rarely and usually does not even complete the cigarette.  Typically about once a month.  Trigger is stress from her husband.  She denies symptoms of COPD.  Continues to defer lung cancer screening, current barrier is scheduling.  Husband has 2 upcoming knee replacement surgery scheduled. Whitecoat syndrome: Blood pressure is now normal.  She believes this is due to less stress and not working.  Home blood pressures are normal. Hyperlipidemia on Lipitor 20 mg nightly.  Tolerates well.   Osteoporosis: We reviewed bone density from 2023.  Due again in July.  She never followed up for Prolia, and still is a little bit tentative.  She started vitamin D supplements 2000 units daily.  Does occasional exercise.  No fractures.  Biphosphonate's are contraindicated due to dental and bone disease.  Assessment  1. Encounter for well adult exam with abnormal findings   2. Osteoporosis, postmenopausal   3. White coat syndrome without diagnosis of hypertension   4. Cigarette nicotine dependence without complication   5. Osteoarthritis of spine  with radiculopathy, lumbosacral region   6. Mixed hyperlipidemia   7. Asymptomatic menopausal state   8. Vitamin D deficiency      Plan  Female Wellness Visit: Age appropriate Health Maintenance and Prevention measures were discussed with patient. Included topics are cancer screening recommendations, ways to keep healthy (see AVS) including dietary and exercise recommendations, regular eye and dental care, use of seat belts, and avoidance of moderate alcohol use and tobacco use.  BMI: discussed patient's BMI and encouraged positive lifestyle modifications to help get to or maintain a target BMI. HM needs and immunizations were addressed and ordered. See below for orders. See HM and immunization section for updates. Routine labs and screening tests ordered including cmp, cbc and lipids where appropriate. Discussed recommendations regarding Vit D and calcium supplementation (see AVS)  Chronic disease management visit and/or acute problem visit: Osteoporosis: Education counseling given.  Plan: Check bone density in July.  I will again recommend Prolia to treat osteoporosis and patient reports she will start at that time if indicated.  Continue vitamin D and calcium supplements.  To continue exercise. Smoking cessation again reinforced.  She is very capable of becoming a complete non-smoker.  Strategies discussed.  Defer lung cancer screening, will discuss again next year Low back pain: Well-controlled Recheck lipid panel LFTs on Lipitor 20 mg nightly.  Tolerating well. Check vitamin D levels now and supplements. Continue home monitoring of blood pressure  Follow up: 12 months for complete physical Orders Placed This Encounter  Procedures   DG  Bone Density   VITAMIN D 25 Hydroxy (Vit-D Deficiency, Fractures)   CBC with Differential/Platelet   Comprehensive metabolic panel   Lipid panel   TSH   Meds ordered this encounter  Medications   atorvastatin (LIPITOR) 20 MG tablet    Sig: Take 1  tablet (20 mg total) by mouth daily.    Dispense:  90 tablet    Refill:  3      Body mass index is 29.51 kg/m. Wt Readings from Last 3 Encounters:  01/02/24 156 lb 3.2 oz (70.9 kg)  03/03/23 154 lb (69.9 kg)  01/01/23 154 lb 12.8 oz (70.2 kg)     Patient Active Problem List   Diagnosis Date Noted Date Diagnosed   Mixed hyperlipidemia 01/01/2023    Osteoporosis, postmenopausal 05/17/2022     DEXA 04/2022: left femur: T=-2.8; rec treatment.     Chronic allergic rhinitis 08/25/2020    Osteoarthritis of spine with radiculopathy, lumbosacral region 06/23/2018    White coat syndrome without diagnosis of hypertension 07/05/2017     .    Obesity (BMI 30-39.9) 07/05/2017    Cigarette nicotine dependence without complication 07/05/2017     Nearly quit: 2-3 cig/month 12/2023: cessation advice given. Defers lung cancer screen    Health Maintenance  Topic Date Due   DTaP/Tdap/Td (2 - Td or Tdap) 08/28/2022   Medicare Annual Wellness (AWV)  03/02/2024   MAMMOGRAM  02/10/2024   DEXA SCAN  05/17/2024   Colonoscopy  01/07/2026   Pneumonia Vaccine 42+ Years old  Completed   INFLUENZA VACCINE  Completed   COVID-19 Vaccine  Completed   Hepatitis C Screening  Completed   Zoster Vaccines- Shingrix  Completed   HPV VACCINES  Aged Out   Immunization History  Administered Date(s) Administered   Fluad Quad(high Dose 65+) 07/21/2019, 08/25/2020, 11/11/2022   Influenza Whole 09/12/2018   Influenza, High Dose Seasonal PF 08/29/2021   Influenza-Unspecified 12/28/2023   PFIZER(Purple Top)SARS-COV-2 Vaccination 01/11/2020, 02/01/2020, 09/28/2020   Pfizer Covid-19 Vaccine Bivalent Booster 29yrs & up 08/29/2021, 11/11/2022   Pneumococcal Conjugate-13 08/25/2020   Pneumococcal Polysaccharide-23 08/28/2012, 04/11/2017   Rotavirus,unspecified  11/11/2022   Tdap 08/28/2012   Unspecified SARS-COV-2 Vaccination 12/28/2023   Zoster Recombinant(Shingrix) 02/22/2022, 12/28/2023   Zoster, Live  11/30/2012   We updated and reviewed the patient's past history in detail and it is documented below. Allergies: Patient is allergic to other. Past Medical History Patient  has a past medical history of Heart murmur, Osteoporosis, postmenopausal (05/17/2022), and Seasonal allergies. Past Surgical History Patient  has a past surgical history that includes Hammer toe surgery and Thumb arthroscopy (Right, 05/31/2022). Family History: Patient family history includes Cancer in her father and paternal grandmother; Heart disease in her mother; Hyperlipidemia in her mother; Hypertension in her father, maternal grandfather, maternal grandmother, and paternal grandfather. Social History:  Patient  reports that she has been smoking cigarettes. She has never used smokeless tobacco. She reports that she does not drink alcohol and does not use drugs.  Review of Systems: Constitutional: negative for fever or malaise Ophthalmic: negative for photophobia, double vision or loss of vision Cardiovascular: negative for chest pain, dyspnea on exertion, or new LE swelling Respiratory: negative for SOB or persistent cough Gastrointestinal: negative for abdominal pain, change in bowel habits or melena Genitourinary: negative for dysuria or gross hematuria, no abnormal uterine bleeding or disharge Musculoskeletal: negative for new gait disturbance or muscular weakness Integumentary: negative for new or persistent rashes, no breast lumps Neurological: negative for  TIA or stroke symptoms Psychiatric: negative for SI or delusions Allergic/Immunologic: negative for hives  Patient Care Team    Relationship Specialty Notifications Start End  Willow Ora, MD PCP - General Family Medicine  08/25/20     Objective  Vitals: BP 127/83   Pulse 76   Temp 97.8 F (36.6 C)   Ht 5\' 1"  (1.549 m)   Wt 156 lb 3.2 oz (70.9 kg)   SpO2 98%   BMI 29.51 kg/m  General:  Well developed, well nourished, no acute distress   Psych:  Alert and orientedx3,normal mood and affect HEENT:  Normocephalic, atraumatic, non-icteric sclera,  supple neck without adenopathy, mass or thyromegaly Cardiovascular:  Normal S1, S2, RRR without gallop, rub or murmur Respiratory:  Good breath sounds bilaterally, CTAB with normal respiratory effort Gastrointestinal: normal bowel sounds, soft, non-tender, no noted masses. No HSM MSK: extremities without edema, joints without erythema or swelling Neurologic:    Mental status is normal.  Gross motor and sensory exams are normal.  No tremor  Commons side effects, risks, benefits, and alternatives for medications and treatment plan prescribed today were discussed, and the patient expressed understanding of the given instructions. Patient is instructed to call or message via MyChart if he/she has any questions or concerns regarding our treatment plan. No barriers to understanding were identified. We discussed Red Flag symptoms and signs in detail. Patient expressed understanding regarding what to do in case of urgent or emergency type symptoms.  Medication list was reconciled, printed and provided to the patient in AVS. Patient instructions and summary information was reviewed with the patient as documented in the AVS. This note was prepared with assistance of Dragon voice recognition software. Occasional wrong-word or sound-a-like substitutions may have occurred due to the inherent limitations of voice recognition software

## 2024-01-02 NOTE — Patient Instructions (Signed)
 Please return in 12 months for your annual complete physical; please come fasting.   I will release your lab results to you on your MyChart account with further instructions. You may see the results before I do, but when I review them I will send you a message with my report or have my assistant call you if things need to be discussed. Please reply to my message with any questions. Thank you!   If you have any questions or concerns, please don't hesitate to send me a message via MyChart or call the office at [phone]. Thank you for visiting with [place] today! It's our pleasure caring for you.   Quit smoking... all the way. Use other stress management tools :)  Please call the office checked below to schedule your appointment for your mammogram and/or bone density screen (the checked studies were ordered): []   Mammogram  [x]   Bone Density: due in July or August  []   [facility]     [address] [place], [place]        [phone]         [x]   [facility]  [address] [place], [place]  [phone]

## 2024-01-06 ENCOUNTER — Encounter: Payer: Self-pay | Admitting: Family Medicine

## 2024-01-06 MED ORDER — VITAMIN D (ERGOCALCIFEROL) 1.25 MG (50000 UNIT) PO CAPS: 50000.0000 [IU] | ORAL_CAPSULE | ORAL | 0 refills | Status: AC

## 2024-01-06 NOTE — Addendum Note (Signed)
 Addended by: Asencion Partridge on: 01/06/2024 06:00 PM   Modules accepted: Orders

## 2024-01-06 NOTE — Progress Notes (Signed)
 See mychart note Dear Ms. Burke Keels, Your lab results look good! The only abnormality is that your vitamin D level is extremely; I recommend taking a high dose supplement weekly for 12 weekly in addition to the 2000 units daily of otc supplements. This is needed for your bone health and to start prolia if want to as we discussed.  Your cholesterol and other numbers are all stable.  NO MORE CIGARETTES!! :) Sincerely, Dr. Mardelle Matte

## 2024-03-09 ENCOUNTER — Ambulatory Visit (INDEPENDENT_AMBULATORY_CARE_PROVIDER_SITE_OTHER)

## 2024-03-09 VITALS — Ht 61.5 in | Wt 152.0 lb

## 2024-03-09 DIAGNOSIS — Z0000 Encounter for general adult medical examination without abnormal findings: Secondary | ICD-10-CM | POA: Diagnosis not present

## 2024-03-09 NOTE — Patient Instructions (Signed)
 [name] , Thank you for taking time out of your busy schedule to complete your Annual Wellness Visit with me. I enjoyed our conversation and look forward to speaking with you again next year. I, as well as your care team,  appreciate your ongoing commitment to your health goals. Please review the following plan we discussed and let me know if I can assist you in the future. Your Game plan/ To Do List    Referrals: If you haven't heard from the office you've been referred to, please reach out to them at the phone provided.   Follow up Visits: Next Medicare AWV with our clinical staff: 01/13/25 3:00 p.m    Have you seen your provider in the last 6 months (3 months if uncontrolled diabetes)? Yes 01/02/24 Next Office Visit with your provider: 01/05/25  Clinician Recommendations:  Aim for 30 minutes of exercise or brisk walking, 6-8 glasses of water, and 5 servings of fruits and vegetables each day.  If you wish to quit smoking, help is available. For free tobacco cessation program offerings call the [facility] at 301-820-6725 or Live Well Line at 337-330-0916. You may also visit [email or web address] or email [email or web address] for more information on other programs.   You may also call 1-800-QUIT-NOW (512-086-7622) or visit www.[place].ch or www.[email or web address] for additional resources on smoking cessation.        This is a list of the screening recommended for you and due dates:  Health Maintenance  Topic Date Due   DTaP/Tdap/Td vaccine (2 - Td or Tdap) 08/28/2022   Mammogram  02/10/2024   Medicare Annual Wellness Visit  03/02/2024   DEXA scan (bone density measurement)  05/17/2024   Flu Shot  05/28/2024   COVID-19 Vaccine (7 - 2024-25 season) 06/29/2024   Colon Cancer Screening  01/07/2026   Pneumonia Vaccine  Completed   Hepatitis C Screening  Completed   Zoster (Shingles) Vaccine  Completed   HPV Vaccine  Aged Out   Meningitis B Vaccine  Aged Out     Advanced directives: (Copy Requested) Please bring a copy of your health care power of attorney and living will to the office to be added to your chart at your convenience. You can mail to [facility] [address] [place], [place] [address] or email to [email or web address] Advance Care Planning is important because it:  [x]  Makes sure you receive the medical care that is consistent with your values, goals, and preferences  [x]  It provides guidance to your family and loved ones and reduces their decisional burden about whether or not they are making the right decisions based on your wishes.  Follow the link provided in your after visit summary or read over the paperwork we have mailed to you to help you started getting your Advance Directives in place. If you need assistance in completing these, please reach out to us  so that we can help you!  See attachments for Preventive Care and Fall Prevention Tips.

## 2024-03-09 NOTE — Progress Notes (Signed)
 Subjective:   Meghan Cox is a 73 y.o. who presents for a Medicare Wellness preventive visit.  As a reminder, Annual Wellness Visits don't include a physical exam, and some assessments may be limited, especially if this visit is performed virtually. We may recommend an in-person visit if needed.  Visit Complete: Virtual I connected with  Meghan Cox on 03/09/24 by a audio enabled telemedicine application and verified that I am speaking with the correct person using two identifiers.  Patient Location: Home  Provider Location: Office/Clinic  I discussed the limitations of evaluation and management by telemedicine. The patient expressed understanding and agreed to proceed.  Vital Signs: Because this visit was a virtual/telehealth visit, some criteria may be missing or patient reported. Any vitals not documented were not able to be obtained and vitals that have been documented are patient reported.  VideoDeclined- This patient declined Librarian, academic. Therefore the visit was completed with audio only.  Persons Participating in Visit: Patient.  AWV Questionnaire: No: Patient Medicare AWV questionnaire was not completed prior to this visit.  Cardiac Risk Factors include: advanced age (>80men, >83 women);dyslipidemia     Objective:     Today's Vitals   03/09/24 1509  Weight: 152 lb (68.9 kg)  Height: 5' 1.5" (1.562 m)   Body mass index is 28.26 kg/m.     03/09/2024    3:13 PM 03/03/2023    8:42 AM 02/28/2022    9:50 AM 11/26/2021    9:33 AM  Advanced Directives  Does Patient Have a Medical Advance Directive? Yes No No No  Type of Estate agent of [place];Living will     Copy of Healthcare Power of Attorney in Chart? No - copy requested     Would patient like information on creating a medical advance directive?  No - Patient declined No - Patient declined     Current Medications (verified) Outpatient Encounter  Medications as of 03/09/2024  Medication Sig   atorvastatin  (LIPITOR) 20 MG tablet Take 1 tablet (20 mg total) by mouth daily.   Calcium  Carb-Cholecalciferol (CALCIUM  600+D3 PO) Take by mouth 2 (two) times daily. Calcium  1200   loratadine (CLARITIN) 10 MG tablet Take 10 mg by mouth daily.   Vitamin D , Ergocalciferol , (DRISDOL ) 1.25 MG (50000 UNIT) CAPS capsule Take 1 capsule (50,000 Units total) by mouth once a week.   No facility-administered encounter medications on file as of 03/09/2024.    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    Seasonal allergies    Past Surgical History:  Procedure Laterality Date   HAMMER TOE SURGERY     THUMB ARTHROSCOPY Right 05/31/2022   Family History  Problem Relation Age of Onset   Heart disease Mother    Hyperlipidemia Mother    Cancer Father        Prostate   Hypertension Father    Hypertension Maternal Grandmother    Hypertension Maternal Grandfather    Cancer Paternal Grandmother        Breast   Hypertension Paternal Grandfather    Social History   Socioeconomic History   Marital status: Married    Spouse name: Not on file   Number of children: Not on file   Years of education: Not on file   Highest education level: Not on file  Occupational History  Not on file  Tobacco Use   Smoking status: Every Day    Current packs/day: 0.25    Types: Cigarettes   Smokeless tobacco: Never  Vaping Use   Vaping status: Never Used  Substance and Sexual Activity   Alcohol use: No   Drug use: No   Sexual activity: Yes  Other Topics Concern   Not on file  Social History Narrative   Not on file   Social Drivers of Health   Financial Resource Strain: Low Risk  (03/09/2024)   Overall Financial Resource Strain (CARDIA)    Difficulty of Paying Living Expenses: Not hard at all  Food Insecurity: No  Food Insecurity (03/09/2024)   Hunger Vital Sign    Worried About Running Out of Food in the Last Year: Never true    Ran Out of Food in the Last Year: Never true  Transportation Needs: No Transportation Needs (03/09/2024)   PRAPARE - Administrator, Civil Service (Medical): No    Lack of Transportation (Non-Medical): No  Physical Activity: Inactive (03/09/2024)   Exercise Vital Sign    Days of Exercise per Week: 0 days    Minutes of Exercise per Session: 0 min  Stress: No Stress Concern Present (03/09/2024)   [employer] of Occupational Health - Occupational Stress Questionnaire    Feeling of Stress : Not at all  Social Connections: Moderately Integrated (03/09/2024)   Social Connection and Isolation Panel [NHANES]    Frequency of Communication with Friends and Family: Twice a week    Frequency of Social Gatherings with Friends and Family: Three times a week    Attends Religious Services: 1 to 4 times per year    Active Member of Clubs or Organizations: No    Attends Banker Meetings: Never    Marital Status: Married    Tobacco Counseling Ready to quit: Not Answered Counseling given: Not Answered    Clinical Intake:  Pre-visit preparation completed: Yes  Pain : No/denies pain     BMI - recorded: 28.26 Nutritional Status: BMI 25 -29 Overweight Nutritional Risks: None Diabetes: No  Lab Results  Component Value Date   HGBA1C 6.0 07/20/2014     How often do you need to have someone help you when you read instructions, pamphlets, or other written materials from your doctor or pharmacy?: 1 - Never  Interpreter Needed?: No  Information entered by :: Lamont Pilsner, LPN   Activities of Daily Living     03/09/2024    3:10 PM  In your present state of health, do you have any difficulty performing the following activities:  Hearing? 0  Vision? 0  Difficulty concentrating or making decisions? 0  Walking or climbing stairs? 0  Dressing or  bathing? 0  Doing errands, shopping? 0  Preparing Food and eating ? N  Using the Toilet? N  In the past six months, have you accidently leaked urine? N  Do you have problems with loss of bowel control? N  Managing your Medications? N  Managing your Finances? N  Housekeeping or managing your Housekeeping? N    Patient Care Team: Luevenia Saha, MD as PCP - General (Family Medicine)  Indicate any recent Medical Services you may have received from other than [facility] providers in the past year (date may be approximate).     Assessment:    This is a routine wellness examination for Meghan Cox.  Hearing/Vision screen Hearing Screening - Comments:: Pt denies any hearing issues  Vision Screening - Comments:: Wears rx glasses - up to date with routine eye exams with Dr Alvina Axon at [facility] ophthalmology     Goals Addressed             This Visit's Progress    Patient Stated       Aim for 30 minutes of exercise or brisk walking, 6-8 glasses of water, and 5 servings of fruits and vegetables each day.        Depression Screen     03/09/2024    3:11 PM 01/02/2024    9:23 AM 03/03/2023    8:40 AM 01/01/2023    1:58 PM 08/14/2022    9:22 AM 05/29/2022    2:11 PM 02/28/2022    9:49 AM  PHQ 2/9 Scores  PHQ - 2 Score 0 0 0 0 0 0 0    Fall Risk     03/09/2024    3:13 PM 01/02/2024    9:22 AM 02/27/2023    2:13 PM 01/01/2023    1:58 PM 05/29/2022    2:11 PM  Fall Risk   Falls in the past year? 0 0 0 0 0  Number falls in past yr: 0 0  0 0  Injury with Fall? 0 0  0 0  Risk for fall due to : No Fall Risks No Fall Risks Impaired vision No Fall Risks No Fall Risks  Follow up Falls prevention discussed Falls evaluation completed Falls prevention discussed Falls evaluation completed Falls evaluation completed    MEDICARE RISK AT HOME:  Medicare Risk at Home Any stairs in or around the home?: Yes If so, are there any without handrails?: No Home free of loose throw rugs in walkways, pet beds,  electrical cords, etc?: Yes Adequate lighting in your home to reduce risk of falls?: Yes Life alert?: No Use of a cane, walker or w/c?: No Grab bars in the bathroom?: No Shower chair or bench in shower?: Yes Elevated toilet seat or a handicapped toilet?: No  TIMED UP AND GO:  Was the test performed?  No  Cognitive Function: 6CIT completed        03/09/2024    3:14 PM 03/03/2023    8:44 AM  6CIT Screen  What Year? 0 points 0 points  What month? 0 points 0 points  What time? 0 points 0 points  Count back from 20 0 points 0 points  Months in reverse 0 points 0 points  Repeat phrase 0 points 0 points  Total Score 0 points 0 points    Immunizations Immunization History  Administered Date(s) Administered   Fluad Quad(high Dose 65+) 07/21/2019, 08/25/2020, 11/11/2022   Influenza Whole 09/12/2018   Influenza, High Dose Seasonal PF 08/29/2021   Influenza-Unspecified 12/28/2023   PFIZER(Purple Top)SARS-COV-2 Vaccination 01/11/2020, 02/01/2020, 09/28/2020   Pfizer Covid-19 Vaccine Bivalent Booster 52yrs & up 08/29/2021, 11/11/2022   Pneumococcal Conjugate-13 08/25/2020   Pneumococcal Polysaccharide-23 08/28/2012, 04/11/2017   Rotavirus,unspecified  11/11/2022   Tdap 08/28/2012   Unspecified SARS-COV-2 Vaccination 12/28/2023   Zoster Recombinant(Shingrix ) 02/22/2022, 12/28/2023   Zoster, Live 11/30/2012    Screening Tests Health Maintenance  Topic Date Due   DTaP/Tdap/Td (2 - Td or Tdap) 08/28/2022   MAMMOGRAM  02/10/2024   DEXA SCAN  05/17/2024   INFLUENZA VACCINE  05/28/2024   COVID-19 Vaccine (7 - 2024-25 season) 06/29/2024   Medicare Annual Wellness (AWV)  03/09/2025   Colonoscopy  01/07/2026   Pneumonia Vaccine 46+ Years old  Completed  Hepatitis C Screening  Completed   Zoster Vaccines- Shingrix   Completed   HPV VACCINES  Aged Out   Meningococcal B Vaccine  Aged Out    Health Maintenance  Health Maintenance Due  Topic Date Due   DTaP/Tdap/Td (2 - Td or  Tdap) 08/28/2022   MAMMOGRAM  02/10/2024   Health Maintenance Items Addressed: See Nurse Notes  Additional Screening:  Vision Screening: Recommended annual ophthalmology exams for early detection of glaucoma and other disorders of the eye.  Dental Screening: Recommended annual dental exams for proper oral hygiene  Community Resource Referral / Chronic Care Management: CRR required this visit?  No   CCM required this visit?  No   Plan:    I have personally reviewed and noted the following in the patient's chart:   Medical and social history Use of alcohol, tobacco or illicit drugs  Current medications and supplements including opioid prescriptions. Patient is not currently taking opioid prescriptions. Functional ability and status Nutritional status Physical activity Advanced directives List of other physicians Hospitalizations, surgeries, and ER visits in previous 12 months Vitals Screenings to include cognitive, depression, and falls Referrals and appointments  In addition, I have reviewed and discussed with patient certain preventive protocols, quality metrics, and best practice recommendations. A written personalized care plan for preventive services as well as general preventive health recommendations were provided to patient.   Bruno Capri, LPN   [id number]   After Visit Summary: (MyChart) Due to this being a telephonic visit, the after visit summary with patients personalized plan was offered to patient via MyChart   Notes: Nothing significant to report at this time.

## 2024-09-01 ENCOUNTER — Telehealth: Payer: Self-pay | Admitting: Pharmacist

## 2024-09-01 NOTE — Progress Notes (Signed)
 Pharmacy Quality Measure Review  This patient is appearing on a report for being at risk of failing the adherence measure for cholesterol (statin) medications this calendar year.   Medication: atorvastatin   Last fill date: 04/11/2024 for 90 day supply  Reviewed medication indication, dosing, and goals of therapy with patient. She endorses that she has been taking atorvastatin  every day but is in need of a refill. At patient's request, contacted pharmacy to facilitate refills.  Madelin Ray, PharmD Clinical Pharmacist [facility]  Population Health [phone]

## 2025-01-05 ENCOUNTER — Encounter: Admitting: Family Medicine

## 2025-03-15 ENCOUNTER — Ambulatory Visit
